# Patient Record
Sex: Male | Born: 1945 | Race: White | Hispanic: No | Marital: Married | Smoking: Never smoker
Health system: Southern US, Community
[De-identification: ages and names within clinical notes are randomized; demographics above are authoritative.]

## PROBLEM LIST (undated history)

## (undated) DIAGNOSIS — R011 Cardiac murmur, unspecified: Secondary | ICD-10-CM

## (undated) DIAGNOSIS — K219 Gastro-esophageal reflux disease without esophagitis: Secondary | ICD-10-CM

## (undated) DIAGNOSIS — I10 Essential (primary) hypertension: Secondary | ICD-10-CM

## (undated) DIAGNOSIS — M1990 Unspecified osteoarthritis, unspecified site: Secondary | ICD-10-CM

## (undated) HISTORY — PX: OTHER SURGICAL HISTORY: SHX169

---

## 2000-04-20 ENCOUNTER — Ambulatory Visit (HOSPITAL_COMMUNITY): Admission: RE | Admit: 2000-04-20 | Discharge: 2000-04-20 | Payer: Self-pay | Admitting: Urology

## 2009-08-21 ENCOUNTER — Emergency Department (HOSPITAL_COMMUNITY): Admission: EM | Admit: 2009-08-21 | Discharge: 2009-08-21 | Payer: Self-pay | Admitting: Emergency Medicine

## 2009-08-31 ENCOUNTER — Ambulatory Visit (HOSPITAL_COMMUNITY): Admission: RE | Admit: 2009-08-31 | Discharge: 2009-08-31 | Payer: Self-pay | Admitting: Orthopedic Surgery

## 2010-09-16 LAB — CBC
HCT: 44.5 % (ref 39.0–52.0)
Platelets: 201 10*3/uL (ref 150–400)
WBC: 5.3 10*3/uL (ref 4.0–10.5)

## 2010-09-16 LAB — BASIC METABOLIC PANEL
BUN: 21 mg/dL (ref 6–23)
CO2: 29 mEq/L (ref 19–32)
Calcium: 9.1 mg/dL (ref 8.4–10.5)
Chloride: 101 mEq/L (ref 96–112)
Creatinine, Ser: 1.17 mg/dL (ref 0.4–1.5)
Potassium: 3.9 mEq/L (ref 3.5–5.1)

## 2010-11-08 NOTE — Op Note (Signed)
[facility]  Patient:    Colton Calderon, Colton Calderon                       MRN: [id number] Proc. Date: 04/20/00 Attending:  Loraine Leriche C. Vernie Ammons, M.D.                           Operative Report  PREOPERATIVE DIAGNOSIS:  Right epididymal cyst and desire for sterilization.  POSTOPERATIVE DIAGNOSIS:  Right epididymal cyst, desire for sterilization, and right varicocele.  OPERATION: 1. Right epididymal cyst marsupialization. 2. Bilateral vasectomy. 3. Right varicocelectomy.  SURGEON:  Mark C. Vernie Ammons, M.D.  ANESTHESIA:  General.  SPECIMEN:  None.  DRAINS:  None.  BLOOD LOSS:  Less than 5 cc.  COMPLICATIONS:  None.  INDICATIONS:  The patient is a 65 year old white male who noted swelling in the right hemiscrotum and discomfort.  He was found in my office to have cystic mass involving the head of the right epididymis.  He also had a small varicocele noted preoperatively, but it did not appear to be a very significant finding.  He also desires sterility and has asked that I perform a vasectomy under the same anesthetic.  He understands the risks, complications, alternatives, and limitations and has elected to proceed.  DESCRIPTION OF PROCEDURE:  After informed consent, the patient went to the operating room, was placed on the table, and administered general anesthesia. His genitalia was sterilely prepped and draped.  I made a medial raphe scrotal incision and then grasped the left vas with the vas clamp, clearing off the overlying tissue and freeing up the vas completely.  Approximately a 2 cm segment of the vas was then excised, and 2-0 silk suture was used to ligate each end of the vas.  I then fulgurated each end of the vas and allowed each portion to drop back into the left hemiscrotum.  I then directed my attention to the right hemiscrotum where I incised the tunica vaginalis and delivered the testicle itself.  Several large cystic structures were noted to  be associated with the upper pole of the epididymis.  I then incised along the medial aspect and opened the cystic structures which were all filled with clear fluid.  A 4-0 chromic suture was then used to marsupialize the edge of the overlying tunica.  Several sutures were placed in interrupted fashion.  I noted, with the testicle out of the scrotum, that there did appear to be a fairly significant varicocele, and because the patient was having pain on that side, I felt it prudent to ligate the varicosities because of the possibility that his discomfort was being caused by the varicocele and not being the cyst. I, therefore, isolated the vein from surrounding arterial and lymphatic structures and ligated these with 2-0 chromic suture.  I then grasped the vas on this side with the vas clamp and again cleared it of surrounding tissues, ligated it doubly proximally and singly distally, fulgurating the ends once again and allowing them to drop back into the cord tissue.  I then replaced the testicle back in its parietal tunica vaginalis and closed that with a running 4-0 chromic suture.  I then replaced the right testicle in its normal anatomic position in the hemiscrotum and made sure all bleeding points were cauterized.  I then closed the deep tissues with a running 3-0 chromic suture and anesthetized the scrotal subcutaneous tissue with 0.25% plain Marcaine.  I then closed the scrotal skin with a running 3-0 chromic suture.  The patient tolerated the procedure well.  There were no intraoperative complications. Needle, sponge, and instrument counts were reportedly correct at the end of the operation.  He will begin on a prescription for 40 Tylox and instructions on postop care and will follow up in my office in two weeks for recheck. DD:  04/20/00 TD:  04/20/00 Job: [id number]

## 2014-01-16 ENCOUNTER — Other Ambulatory Visit (HOSPITAL_COMMUNITY): Payer: Self-pay | Admitting: Orthopaedic Surgery

## 2014-02-07 ENCOUNTER — Encounter (HOSPITAL_COMMUNITY): Payer: Self-pay | Admitting: Pharmacy Technician

## 2014-02-08 NOTE — Patient Instructions (Signed)
Colton LudwigCraig S Calderon  02/08/2014   Your procedure is scheduled on:  02/17/14    Report to [facility] Main Entrance.  Follow the Signs to Short Stay Center at   0530     am  Call this number if you have problems the morning of surgery: [phone]   Remember:   Do not eat food or drink liquids after midnight.   Take these medicines the morning of surgery with A SIP OF WATER:    Do not wear jewelry,   Do not wear lotions, powders, or perfumes. , deodorant  . Men may shave face and neck.  Do not bring valuables to the hospital.  Contacts, dentures or bridgework may not be worn into surgery.  Leave suitcase in the car. After surgery it may be brought to your room.  For patients admitted to the hospital, checkout time is 11:00 AM the day of  discharge.      [facility] - Preparing for Surgery Before surgery, you can play an important role.  Because skin is not sterile, your skin needs to be as free of germs as possible.  You can reduce the number of germs on your skin by washing with CHG (chlorahexidine gluconate) soap before surgery.  CHG is an antiseptic cleaner which kills germs and bonds with the skin to continue killing germs even after washing. Please DO NOT use if you have an allergy to CHG or antibacterial soaps.  If your skin becomes reddened/irritated stop using the CHG and inform your nurse when you arrive at Short Stay. Do not shave (including legs and underarms) for at least 48 hours prior to the first CHG shower.  You may shave your face/neck. Please follow these instructions carefully:  1.  Shower with CHG Soap the night before surgery and the  morning of Surgery.  2.  If you choose to wash your hair, wash your hair first as usual with your  normal  shampoo.  3.  After you shampoo, rinse your hair and body thoroughly to remove the  shampoo.                           4.  Use CHG as you would any other liquid soap.  You can apply chg directly  to the skin and wash   Gently with a scrungie or clean washcloth.  5.  Apply the CHG Soap to your body ONLY FROM THE NECK DOWN.   Do not use on face/ open                           Wound or open sores. Avoid contact with eyes, ears mouth and genitals (private parts).                       Wash face,  Genitals (private parts) with your normal soap.             6.  Wash thoroughly, paying special attention to the area where your surgery  will be performed.  7.  Thoroughly rinse your body with warm water from the neck down.  8.  DO NOT shower/wash with your normal soap after using and rinsing off  the CHG Soap.                9.  Pat yourself dry with a clean towel.  10.  Wear clean pajamas.            11.  Place clean sheets on your bed the night of your first shower and do not  sleep with pets. Day of Surgery : Do not apply any lotions/deodorants the morning of surgery.  Please wear clean clothes to the hospital/surgery center.  FAILURE TO FOLLOW THESE INSTRUCTIONS MAY RESULT IN THE CANCELLATION OF YOUR SURGERY PATIENT SIGNATURE_________________________________  NURSE SIGNATURE__________________________________  ________________________________________________________________________  WHAT IS A BLOOD TRANSFUSION? Blood Transfusion Information  A transfusion is the replacement of blood or some of its parts. Blood is made up of multiple cells which provide different functions.  Red blood cells carry oxygen and are used for blood loss replacement.  White blood cells fight against infection.  Platelets control bleeding.  Plasma helps clot blood.  Other blood products are available for specialized needs, such as hemophilia or other clotting disorders. BEFORE THE TRANSFUSION  Who gives blood for transfusions?   Healthy volunteers who are fully evaluated to make sure their blood is safe. This is blood bank blood. Transfusion therapy is the safest it has ever been in the practice of medicine. Before  blood is taken from a donor, a complete history is taken to make sure that person has no history of diseases nor engages in risky social behavior (examples are intravenous drug use or sexual activity with multiple partners). The donor's travel history is screened to minimize risk of transmitting infections, such as malaria. The donated blood is tested for signs of infectious diseases, such as HIV and hepatitis. The blood is then tested to be sure it is compatible with you in order to minimize the chance of a transfusion reaction. If you or a relative donates blood, this is often done in anticipation of surgery and is not appropriate for emergency situations. It takes many days to process the donated blood. RISKS AND COMPLICATIONS Although transfusion therapy is very safe and saves many lives, the main dangers of transfusion include:   Getting an infectious disease.  Developing a transfusion reaction. This is an allergic reaction to something in the blood you were given. Every precaution is taken to prevent this. The decision to have a blood transfusion has been considered carefully by your caregiver before blood is given. Blood is not given unless the benefits outweigh the risks. AFTER THE TRANSFUSION  Right after receiving a blood transfusion, you will usually feel much better and more energetic. This is especially true if your red blood cells have gotten low (anemic). The transfusion raises the level of the red blood cells which carry oxygen, and this usually causes an energy increase.  The nurse administering the transfusion will monitor you carefully for complications. HOME CARE INSTRUCTIONS  No special instructions are needed after a transfusion. You may find your energy is better. Speak with your caregiver about any limitations on activity for underlying diseases you may have. SEEK MEDICAL CARE IF:   Your condition is not improving after your transfusion.  You develop redness or irritation  at the intravenous (IV) site. SEEK IMMEDIATE MEDICAL CARE IF:  Any of the following symptoms occur over the next 12 hours:  Shaking chills.  You have a temperature by mouth above 102 F (38.9 C), not controlled by medicine.  Chest, back, or muscle pain.  People around you feel you are not acting correctly or are confused.  Shortness of breath or difficulty breathing.  Dizziness and fainting.  You get a rash or develop  hives.  You have a decrease in urine output.  Your urine turns a dark color or changes to pink, red, or brown. Any of the following symptoms occur over the next 10 days:  You have a temperature by mouth above 102 F (38.9 C), not controlled by medicine.  Shortness of breath.  Weakness after normal activity.  The white part of the eye turns yellow (jaundice).  You have a decrease in the amount of urine or are urinating less often.  Your urine turns a dark color or changes to pink, red, or brown. Document Released: 06/06/2000 Document Revised: 09/01/2011 Document Reviewed: 01/24/2008 ExitCare Patient Information 2014 ExitCare, [place].  _______________________________________________________________________   Please read over the following fact sheets that you were given: MRSA Information, coughing and deep breathing exercises, leg exercises

## 2014-02-09 ENCOUNTER — Ambulatory Visit (HOSPITAL_COMMUNITY)
Admission: RE | Admit: 2014-02-09 | Discharge: 2014-02-09 | Disposition: A | Discharge status: Home / Self Care | Payer: 59 | Source: Ambulatory Visit | Attending: Orthopaedic Surgery | Admitting: Orthopaedic Surgery

## 2014-02-09 ENCOUNTER — Encounter (HOSPITAL_COMMUNITY)
Admission: RE | Admit: 2014-02-09 | Discharge: 2014-02-09 | Disposition: A | Discharge status: Home / Self Care | Payer: 59 | Source: Ambulatory Visit | Attending: Orthopaedic Surgery | Admitting: Orthopaedic Surgery

## 2014-02-09 ENCOUNTER — Encounter (HOSPITAL_COMMUNITY): Payer: Self-pay

## 2014-02-09 DIAGNOSIS — Z01818 Encounter for other preprocedural examination: Secondary | ICD-10-CM | POA: Insufficient documentation

## 2014-02-09 DIAGNOSIS — I10 Essential (primary) hypertension: Secondary | ICD-10-CM | POA: Diagnosis not present

## 2014-02-09 HISTORY — DX: Cardiac murmur, unspecified: R01.1

## 2014-02-09 HISTORY — DX: Gastro-esophageal reflux disease without esophagitis: K21.9

## 2014-02-09 HISTORY — DX: Unspecified osteoarthritis, unspecified site: M19.90

## 2014-02-09 HISTORY — DX: Essential (primary) hypertension: I10

## 2014-02-09 LAB — CBC
HEMATOCRIT: 43.4 % (ref 39.0–52.0)
HEMOGLOBIN: 14.8 g/dL (ref 13.0–17.0)
MCH: 32.1 pg (ref 26.0–34.0)
MCHC: 34.1 g/dL (ref 30.0–36.0)
MCV: 94.1 fL (ref 78.0–100.0)
PLATELETS: 202 10*3/uL (ref 150–400)
RBC: 4.61 MIL/uL (ref 4.22–5.81)
RDW: 13.3 % (ref 11.5–15.5)
WBC: 4.6 10*3/uL (ref 4.0–10.5)

## 2014-02-09 LAB — BASIC METABOLIC PANEL
ANION GAP: 10 (ref 5–15)
BUN: 21 mg/dL (ref 6–23)
CALCIUM: 9.5 mg/dL (ref 8.4–10.5)
CO2: 27 mEq/L (ref 19–32)
CREATININE: 0.99 mg/dL (ref 0.50–1.35)
Chloride: 101 mEq/L (ref 96–112)
GFR calc non Af Amer: 82 mL/min — ABNORMAL LOW (ref >90)
Glucose, Bld: 136 mg/dL — ABNORMAL HIGH (ref 70–99)
Potassium: 4.6 mEq/L (ref 3.7–5.3)
SODIUM: 138 meq/L (ref 137–147)

## 2014-02-09 LAB — SURGICAL PCR SCREEN
MRSA, PCR: NEGATIVE
STAPHYLOCOCCUS AUREUS: NEGATIVE

## 2014-02-09 LAB — URINALYSIS, ROUTINE W REFLEX MICROSCOPIC
BILIRUBIN URINE: NEGATIVE
Glucose, UA: NEGATIVE mg/dL
Hgb urine dipstick: NEGATIVE
KETONES UR: NEGATIVE mg/dL
LEUKOCYTES UA: NEGATIVE
NITRITE: NEGATIVE
PROTEIN: NEGATIVE mg/dL
Specific Gravity, Urine: 1.02 (ref 1.005–1.030)
Urobilinogen, UA: 0.2 mg/dL (ref 0.0–1.0)
pH: 5.5 (ref 5.0–8.0)

## 2014-02-09 LAB — APTT: aPTT: 27 seconds (ref 24–37)

## 2014-02-09 LAB — ABO/RH: ABO/RH(D): A POS

## 2014-02-09 LAB — PROTIME-INR
INR: 1.03 (ref 0.00–1.49)
Prothrombin Time: 13.5 seconds (ref 11.6–15.2)

## 2014-02-09 NOTE — Progress Notes (Signed)
[facility]

## 2014-02-17 ENCOUNTER — Encounter (HOSPITAL_COMMUNITY)
Admission: RE | Disposition: A | Discharge status: Home Health | Payer: Self-pay | Source: Ambulatory Visit | Attending: Orthopaedic Surgery

## 2014-02-17 ENCOUNTER — Inpatient Hospital Stay (HOSPITAL_COMMUNITY): Payer: 59

## 2014-02-17 ENCOUNTER — Encounter (HOSPITAL_COMMUNITY): Payer: Self-pay | Admitting: *Deleted

## 2014-02-17 ENCOUNTER — Inpatient Hospital Stay (HOSPITAL_COMMUNITY): Payer: 59 | Admitting: Certified Registered Nurse Anesthetist

## 2014-02-17 ENCOUNTER — Inpatient Hospital Stay (HOSPITAL_COMMUNITY)
Admission: RE | Admit: 2014-02-17 | Discharge: 2014-02-19 | DRG: 470 | Disposition: A | Discharge status: Home Health | Payer: 59 | Source: Ambulatory Visit | Attending: Orthopaedic Surgery | Admitting: Orthopaedic Surgery

## 2014-02-17 ENCOUNTER — Encounter (HOSPITAL_COMMUNITY): Payer: 59 | Admitting: Certified Registered Nurse Anesthetist

## 2014-02-17 DIAGNOSIS — M1612 Unilateral primary osteoarthritis, left hip: Secondary | ICD-10-CM

## 2014-02-17 DIAGNOSIS — Z6825 Body mass index (BMI) 25.0-25.9, adult: Secondary | ICD-10-CM

## 2014-02-17 DIAGNOSIS — I10 Essential (primary) hypertension: Secondary | ICD-10-CM | POA: Diagnosis present

## 2014-02-17 DIAGNOSIS — K219 Gastro-esophageal reflux disease without esophagitis: Secondary | ICD-10-CM | POA: Diagnosis present

## 2014-02-17 DIAGNOSIS — Z79899 Other long term (current) drug therapy: Secondary | ICD-10-CM | POA: Diagnosis not present

## 2014-02-17 DIAGNOSIS — M1610 Unilateral primary osteoarthritis, unspecified hip: Secondary | ICD-10-CM | POA: Diagnosis present

## 2014-02-17 DIAGNOSIS — Z96642 Presence of left artificial hip joint: Secondary | ICD-10-CM

## 2014-02-17 DIAGNOSIS — D62 Acute posthemorrhagic anemia: Secondary | ICD-10-CM | POA: Diagnosis not present

## 2014-02-17 DIAGNOSIS — M25559 Pain in unspecified hip: Secondary | ICD-10-CM | POA: Diagnosis present

## 2014-02-17 DIAGNOSIS — M169 Osteoarthritis of hip, unspecified: Secondary | ICD-10-CM | POA: Diagnosis present

## 2014-02-17 HISTORY — PX: TOTAL HIP ARTHROPLASTY: SHX124

## 2014-02-17 LAB — TYPE AND SCREEN
ABO/RH(D): A POS
ANTIBODY SCREEN: NEGATIVE

## 2014-02-17 SURGERY — ARTHROPLASTY, HIP, TOTAL, ANTERIOR APPROACH
Anesthesia: Spinal | Site: Hip | Laterality: Left

## 2014-02-17 MED ORDER — SODIUM CHLORIDE 0.9 % IJ SOLN
INTRAMUSCULAR | Status: AC
  Filled 2014-02-17: qty 10

## 2014-02-17 MED ORDER — SODIUM CHLORIDE 0.9 % IV SOLN
INTRAVENOUS | Status: DC
  Administered 2014-02-17 – 2014-02-18 (×3): via INTRAVENOUS

## 2014-02-17 MED ORDER — SODIUM CHLORIDE 0.9 % IR SOLN
Status: DC | PRN
  Administered 2014-02-17: 1000 mL

## 2014-02-17 MED ORDER — MENTHOL 3 MG MT LOZG
1.0000 | LOZENGE | OROMUCOSAL | Status: DC | PRN
  Filled 2014-02-17: qty 9

## 2014-02-17 MED ORDER — EPHEDRINE SULFATE 50 MG/ML IJ SOLN
INTRAMUSCULAR | Status: AC
  Filled 2014-02-17: qty 1

## 2014-02-17 MED ORDER — ACETAMINOPHEN 325 MG PO TABS
650.0000 mg | ORAL_TABLET | Freq: Four times a day (QID) | ORAL | Status: DC | PRN
  Administered 2014-02-17 – 2014-02-19 (×4): 650 mg via ORAL
  Filled 2014-02-17 (×4): qty 2

## 2014-02-17 MED ORDER — PHENYLEPHRINE HCL 10 MG/ML IJ SOLN
INTRAMUSCULAR | Status: DC | PRN
  Administered 2014-02-17 (×4): 20 ug via INTRAVENOUS

## 2014-02-17 MED ORDER — HYDROCHLOROTHIAZIDE 25 MG PO TABS
25.0000 mg | ORAL_TABLET | Freq: Every morning | ORAL | Status: DC
  Administered 2014-02-17 – 2014-02-19 (×2): 25 mg via ORAL
  Filled 2014-02-17 (×3): qty 1

## 2014-02-17 MED ORDER — METOCLOPRAMIDE HCL 5 MG/ML IJ SOLN: 5.0000 mg | Freq: Three times a day (TID) | INTRAMUSCULAR | Status: DC | PRN

## 2014-02-17 MED ORDER — ATROPINE SULFATE 0.4 MG/ML IJ SOLN
INTRAMUSCULAR | Status: AC
  Filled 2014-02-17: qty 1

## 2014-02-17 MED ORDER — PANTOPRAZOLE SODIUM 40 MG PO TBEC
40.0000 mg | DELAYED_RELEASE_TABLET | Freq: Every day | ORAL | Status: DC
  Administered 2014-02-17 – 2014-02-19 (×3): 40 mg via ORAL
  Filled 2014-02-17 (×3): qty 1

## 2014-02-17 MED ORDER — LACTATED RINGERS IV SOLN
INTRAVENOUS | Status: DC | PRN
  Administered 2014-02-17 (×2): via INTRAVENOUS

## 2014-02-17 MED ORDER — ATORVASTATIN CALCIUM 10 MG PO TABS
10.0000 mg | ORAL_TABLET | Freq: Every day | ORAL | Status: DC
  Administered 2014-02-17 – 2014-02-19 (×2): 10 mg via ORAL
  Filled 2014-02-17 (×3): qty 1

## 2014-02-17 MED ORDER — 0.9 % SODIUM CHLORIDE (POUR BTL) OPTIME
TOPICAL | Status: DC | PRN
  Administered 2014-02-17: 1000 mL

## 2014-02-17 MED ORDER — FENTANYL CITRATE 0.05 MG/ML IJ SOLN
INTRAMUSCULAR | Status: DC | PRN
  Administered 2014-02-17 (×2): 50 ug via INTRAVENOUS

## 2014-02-17 MED ORDER — DEXTROSE 5 % IV SOLN
500.0000 mg | Freq: Four times a day (QID) | INTRAVENOUS | Status: DC | PRN
  Administered 2014-02-17: 500 mg via INTRAVENOUS
  Filled 2014-02-17: qty 5

## 2014-02-17 MED ORDER — ASPIRIN EC 325 MG PO TBEC
325.0000 mg | DELAYED_RELEASE_TABLET | Freq: Two times a day (BID) | ORAL | Status: DC
  Administered 2014-02-18 – 2014-02-19 (×3): 325 mg via ORAL
  Filled 2014-02-17 (×5): qty 1

## 2014-02-17 MED ORDER — ONDANSETRON HCL 4 MG/2ML IJ SOLN
4.0000 mg | Freq: Four times a day (QID) | INTRAMUSCULAR | Status: DC | PRN
  Administered 2014-02-18: 4 mg via INTRAVENOUS
  Filled 2014-02-17: qty 2

## 2014-02-17 MED ORDER — POLYETHYLENE GLYCOL 3350 17 G PO PACK: 17.0000 g | PACK | Freq: Every day | ORAL | Status: DC | PRN

## 2014-02-17 MED ORDER — FENTANYL CITRATE 0.05 MG/ML IJ SOLN
INTRAMUSCULAR | Status: AC
  Filled 2014-02-17: qty 2

## 2014-02-17 MED ORDER — PROPOFOL 10 MG/ML IV BOLUS
INTRAVENOUS | Status: AC
  Filled 2014-02-17: qty 20

## 2014-02-17 MED ORDER — ONDANSETRON HCL 4 MG/2ML IJ SOLN
INTRAMUSCULAR | Status: AC
  Filled 2014-02-17: qty 2

## 2014-02-17 MED ORDER — MIDAZOLAM HCL 5 MG/5ML IJ SOLN
INTRAMUSCULAR | Status: DC | PRN
  Administered 2014-02-17: 0.5 mg via INTRAVENOUS
  Administered 2014-02-17: 1 mg via INTRAVENOUS
  Administered 2014-02-17: 0.5 mg via INTRAVENOUS

## 2014-02-17 MED ORDER — OXYCODONE HCL 5 MG PO TABS: 5.0000 mg | ORAL_TABLET | Freq: Once | ORAL | Status: DC | PRN

## 2014-02-17 MED ORDER — MEPERIDINE HCL 50 MG/ML IJ SOLN: 6.2500 mg | INTRAMUSCULAR | Status: DC | PRN

## 2014-02-17 MED ORDER — DEXAMETHASONE SODIUM PHOSPHATE 10 MG/ML IJ SOLN
INTRAMUSCULAR | Status: AC
  Filled 2014-02-17: qty 1

## 2014-02-17 MED ORDER — ALUM & MAG HYDROXIDE-SIMETH 200-200-20 MG/5ML PO SUSP: 30.0000 mL | ORAL | Status: DC | PRN

## 2014-02-17 MED ORDER — PROPOFOL INFUSION 10 MG/ML OPTIME
INTRAVENOUS | Status: DC | PRN
  Administered 2014-02-17: 140 ug/kg/min via INTRAVENOUS

## 2014-02-17 MED ORDER — LISINOPRIL 40 MG PO TABS
40.0000 mg | ORAL_TABLET | Freq: Every morning | ORAL | Status: DC
  Administered 2014-02-17: 40 mg via ORAL
  Administered 2014-02-19: 20 mg via ORAL
  Filled 2014-02-17 (×3): qty 1

## 2014-02-17 MED ORDER — METOCLOPRAMIDE HCL 10 MG PO TABS: 5.0000 mg | ORAL_TABLET | Freq: Three times a day (TID) | ORAL | Status: DC | PRN

## 2014-02-17 MED ORDER — METHOCARBAMOL 500 MG PO TABS
500.0000 mg | ORAL_TABLET | Freq: Four times a day (QID) | ORAL | Status: DC | PRN
  Administered 2014-02-17 – 2014-02-19 (×5): 500 mg via ORAL
  Filled 2014-02-17 (×5): qty 1

## 2014-02-17 MED ORDER — CEFAZOLIN SODIUM-DEXTROSE 2-3 GM-% IV SOLR
2.0000 g | INTRAVENOUS | Status: AC
  Administered 2014-02-17: 2 g via INTRAVENOUS

## 2014-02-17 MED ORDER — MIDAZOLAM HCL 2 MG/2ML IJ SOLN
INTRAMUSCULAR | Status: AC
  Filled 2014-02-17: qty 2

## 2014-02-17 MED ORDER — DIPHENHYDRAMINE HCL 12.5 MG/5ML PO ELIX: 12.5000 mg | ORAL_SOLUTION | ORAL | Status: DC | PRN

## 2014-02-17 MED ORDER — ONDANSETRON HCL 4 MG PO TABS
4.0000 mg | ORAL_TABLET | Freq: Four times a day (QID) | ORAL | Status: DC | PRN
  Administered 2014-02-19: 4 mg via ORAL
  Filled 2014-02-17: qty 1

## 2014-02-17 MED ORDER — ACETAMINOPHEN 650 MG RE SUPP: 650.0000 mg | Freq: Four times a day (QID) | RECTAL | Status: DC | PRN

## 2014-02-17 MED ORDER — TRANEXAMIC ACID 100 MG/ML IV SOLN
1000.0000 mg | INTRAVENOUS | Status: AC
  Administered 2014-02-17: 1000 mg via INTRAVENOUS
  Filled 2014-02-17: qty 10

## 2014-02-17 MED ORDER — OXYCODONE HCL 5 MG PO TABS
5.0000 mg | ORAL_TABLET | ORAL | Status: DC | PRN
  Administered 2014-02-17: 10 mg via ORAL
  Administered 2014-02-17 (×2): 5 mg via ORAL
  Administered 2014-02-17: 10 mg via ORAL
  Administered 2014-02-18 – 2014-02-19 (×4): 5 mg via ORAL
  Administered 2014-02-19: 10 mg via ORAL
  Filled 2014-02-17 (×2): qty 2
  Filled 2014-02-17 (×6): qty 1
  Filled 2014-02-17: qty 2

## 2014-02-17 MED ORDER — DOCUSATE SODIUM 100 MG PO CAPS
100.0000 mg | ORAL_CAPSULE | Freq: Two times a day (BID) | ORAL | Status: DC
  Administered 2014-02-17 – 2014-02-19 (×5): 100 mg via ORAL

## 2014-02-17 MED ORDER — HYDROMORPHONE HCL PF 1 MG/ML IJ SOLN
1.0000 mg | INTRAMUSCULAR | Status: DC | PRN
  Administered 2014-02-19: 1 mg via INTRAVENOUS
  Filled 2014-02-17: qty 1

## 2014-02-17 MED ORDER — CEFAZOLIN SODIUM-DEXTROSE 2-3 GM-% IV SOLR
INTRAVENOUS | Status: AC
  Filled 2014-02-17: qty 50

## 2014-02-17 MED ORDER — BUPIVACAINE IN DEXTROSE 0.75-8.25 % IT SOLN
INTRATHECAL | Status: DC | PRN
  Administered 2014-02-17: 2 mL via INTRATHECAL

## 2014-02-17 MED ORDER — ZOLPIDEM TARTRATE 5 MG PO TABS: 5.0000 mg | ORAL_TABLET | Freq: Every evening | ORAL | Status: DC | PRN

## 2014-02-17 MED ORDER — DEXAMETHASONE SODIUM PHOSPHATE 4 MG/ML IJ SOLN
INTRAMUSCULAR | Status: DC | PRN
  Administered 2014-02-17: 10 mg via INTRAVENOUS

## 2014-02-17 MED ORDER — OXYCODONE HCL 5 MG/5ML PO SOLN
5.0000 mg | Freq: Once | ORAL | Status: DC | PRN
  Filled 2014-02-17: qty 5

## 2014-02-17 MED ORDER — PHENOL 1.4 % MT LIQD
1.0000 | OROMUCOSAL | Status: DC | PRN
  Filled 2014-02-17: qty 177

## 2014-02-17 MED ORDER — PROMETHAZINE HCL 25 MG/ML IJ SOLN: 6.2500 mg | INTRAMUSCULAR | Status: DC | PRN

## 2014-02-17 MED ORDER — HYDROMORPHONE HCL PF 1 MG/ML IJ SOLN: 0.2500 mg | INTRAMUSCULAR | Status: DC | PRN

## 2014-02-17 MED ORDER — ONDANSETRON HCL 4 MG/2ML IJ SOLN
INTRAMUSCULAR | Status: DC | PRN
  Administered 2014-02-17: 4 mg via INTRAVENOUS

## 2014-02-17 MED ORDER — CEFAZOLIN SODIUM 1-5 GM-% IV SOLN
1.0000 g | Freq: Four times a day (QID) | INTRAVENOUS | Status: AC
  Administered 2014-02-17 (×2): 1 g via INTRAVENOUS
  Filled 2014-02-17 (×3): qty 50

## 2014-02-17 SURGICAL SUPPLY — 48 items
APL SKNCLS STERI-STRIP NONHPOA (GAUZE/BANDAGES/DRESSINGS) ×1
BAG SPEC THK2 15X12 ZIP CLS (MISCELLANEOUS)
BAG ZIPLOCK 12X15 (MISCELLANEOUS) IMPLANT
BENZOIN TINCTURE PRP APPL 2/3 (GAUZE/BANDAGES/DRESSINGS) ×1 IMPLANT
BLADE SAW SGTL 18X1.27X75 (BLADE) ×2 IMPLANT
CAPT HIP PF COP ×1 IMPLANT
CELLS DAT CNTRL 66122 CELL SVR (MISCELLANEOUS) ×1 IMPLANT
COVER PERINEAL POST (MISCELLANEOUS) ×2 IMPLANT
DRAPE C-ARM 42X120 X-RAY (DRAPES) ×2 IMPLANT
DRAPE STERI IOBAN 125X83 (DRAPES) ×2 IMPLANT
DRAPE U-SHAPE 47X51 STRL (DRAPES) ×6 IMPLANT
DRSG AQUACEL AG ADV 3.5X10 (GAUZE/BANDAGES/DRESSINGS) ×2 IMPLANT
DURAPREP 26ML APPLICATOR (WOUND CARE) ×2 IMPLANT
ELECT BLADE TIP CTD 4 INCH (ELECTRODE) ×2 IMPLANT
ELECT REM PT RETURN 9FT ADLT (ELECTROSURGICAL) ×2
ELECTRODE REM PT RTRN 9FT ADLT (ELECTROSURGICAL) ×1 IMPLANT
FACESHIELD WRAPAROUND (MASK) ×4 IMPLANT
FACESHIELD WRAPAROUND OR TEAM (MASK) ×4 IMPLANT
GAUZE XEROFORM 1X8 LF (GAUZE/BANDAGES/DRESSINGS) IMPLANT
GLOVE BIO SURGEON STRL SZ7.5 (GLOVE) ×2 IMPLANT
GLOVE BIO SURGEON STRL SZ8 (GLOVE) ×1 IMPLANT
GLOVE BIOGEL PI IND STRL 7.0 (GLOVE) IMPLANT
GLOVE BIOGEL PI IND STRL 8 (GLOVE) ×2 IMPLANT
GLOVE BIOGEL PI IND STRL 8.5 (GLOVE) IMPLANT
GLOVE BIOGEL PI INDICATOR 7.0 (GLOVE) ×1
GLOVE BIOGEL PI INDICATOR 8 (GLOVE) ×2
GLOVE BIOGEL PI INDICATOR 8.5 (GLOVE) ×1
GLOVE ECLIPSE 8.0 STRL XLNG CF (GLOVE) ×2 IMPLANT
GLOVE SURG SS PI 6.5 STRL IVOR (GLOVE) ×1 IMPLANT
GLOVE SURG SS PI 7.0 STRL IVOR (GLOVE) ×1 IMPLANT
GOWN STRL REUS W/TWL XL LVL3 (GOWN DISPOSABLE) ×6 IMPLANT
HANDPIECE INTERPULSE COAX TIP (DISPOSABLE) ×2
KIT BASIN OR (CUSTOM PROCEDURE TRAY) ×2 IMPLANT
PACK TOTAL JOINT (CUSTOM PROCEDURE TRAY) ×2 IMPLANT
RETRACTOR WND ALEXIS 18 MED (MISCELLANEOUS) ×1 IMPLANT
RTRCTR WOUND ALEXIS 18CM MED (MISCELLANEOUS) ×2
SET HNDPC FAN SPRY TIP SCT (DISPOSABLE) ×1 IMPLANT
STAPLER VISISTAT 35W (STAPLE) IMPLANT
STRIP CLOSURE SKIN 1/2X4 (GAUZE/BANDAGES/DRESSINGS) ×1 IMPLANT
SUT ETHIBOND NAB CT1 #1 30IN (SUTURE) ×2 IMPLANT
SUT MNCRL AB 4-0 PS2 18 (SUTURE) ×1 IMPLANT
SUT VIC AB 0 CT1 36 (SUTURE) ×2 IMPLANT
SUT VIC AB 1 CT1 36 (SUTURE) ×2 IMPLANT
SUT VIC AB 2-0 CT1 27 (SUTURE) ×4
SUT VIC AB 2-0 CT1 TAPERPNT 27 (SUTURE) ×2 IMPLANT
TOWEL OR 17X26 10 PK STRL BLUE (TOWEL DISPOSABLE) ×2 IMPLANT
TOWEL OR NON WOVEN STRL DISP B (DISPOSABLE) ×2 IMPLANT
TRAY FOLEY CATH 16FRSI W/METER (SET/KITS/TRAYS/PACK) ×1 IMPLANT

## 2014-02-17 NOTE — Progress Notes (Signed)
Physical Therapy Treatment Patient Details Name: Colton Calderon MRN: [id number] WICHMANB: 27-Aug-1945 Today's Date: 02/17/2014    History of Present Illness L THR    PT Comments    Pt s/p L THR presents with decreased L LE strength/ROM and post op pain limiting functional mobility.  Pt should progress to d/c home with family assist and HHPT follow up.  Follow Up Recommendations  Home health PT     Equipment Recommendations  None recommended by PT    Recommendations for Other Services OT consult     Precautions / Restrictions Precautions Precautions: Fall Restrictions Weight Bearing Restrictions: No Other Position/Activity Restrictions: WBAT    Mobility  Bed Mobility Overal bed mobility: Needs Assistance Bed Mobility: Supine to Sit     Supine to sit: Min assist;Mod assist     General bed mobility comments: cues for sequence and use of R LE to self assist  Transfers Overall transfer level: Needs assistance Equipment used: Rolling walker (2 wheeled) Transfers: Sit to/from Stand Sit to Stand: Min assist;Mod assist         General transfer comment: cues for LE management and use of UEs to self assist  Ambulation/Gait Ambulation/Gait assistance: Min assist;Mod assist Ambulation Distance (Feet): 22 Feet Assistive device: Rolling walker (2 wheeled) Gait Pattern/deviations: Step-to pattern;Decreased step length - right;Decreased step length - left;Shuffle;Trunk flexed     General Gait Details: cues for posture, position from RW and sequence   Stairs            Wheelchair Mobility    Modified Rankin (Stroke Patients Only)       Balance                                    Cognition Arousal/Alertness: Awake/alert Behavior During Therapy: WFL for tasks assessed/performed Overall Cognitive Status: Within Functional Limits for tasks assessed                      Exercises Total Joint Exercises Ankle Circles/Pumps: AROM;Both;10  reps;Supine Quad Sets: AROM;Both;10 reps;Supine Heel Slides: AAROM;15 reps;Supine;Left Hip ABduction/ADduction: AAROM;Left;10 reps;Supine    General Comments        Pertinent Vitals/Pain Pain Assessment: 0-10 Pain Score: 4  Pain Location: L hip Pain Descriptors / Indicators: Aching;Burning;Sore Pain Intervention(s): Limited activity within patient's tolerance;Monitored during session;Premedicated before session;Ice applied    Home Living Family/patient expects to be discharged to:: Private residence Living Arrangements: Spouse/significant other Available Help at Discharge: Family Type of Home: House Home Access: Stairs to enter Entrance Stairs-Rails: Left Home Layout: One level Home Equipment: Environmental consultant - 2 wheels;Cane - quad;Crutches      Prior Function Level of Independence: Independent          PT Goals (current goals can now be found in the care plan section) Acute Rehab PT Goals Patient Stated Goal: Wax my car  PT Goal Formulation: With patient Time For Goal Achievement: 02/25/14 Potential to Achieve Goals: Good    Frequency  7X/week    PT Plan      Co-evaluation             End of Session Equipment Utilized During Treatment: Gait belt Activity Tolerance: Patient tolerated treatment well;Other (comment) (mild nausea/dizziness ) Patient left: in chair;with call bell/phone within reach;with family/visitor present     Time: 1547-1630 PT Time Calculation (min): 43 min  Charges:  $Gait Training: 8-22 mins $Therapeutic  Exercise: 8-22 mins                    G Codes:      Colton Calderon 19-Mar-2014, 6:02 PM

## 2014-02-17 NOTE — Progress Notes (Addendum)
CARE MANAGEMENT NOTE 02/17/2014  Patient:  Colton Calderon, Colton Calderon   Account Number:  [id number]  Date Initiated:  02/17/2014  Documentation initiated by:  DAVIS,RHONDA  Subjective/Objective Assessment:   left total hip ant approach     Action/Plan:   home with hhc and needed dme   Anticipated DC Date:  02/20/2014   Anticipated DC Plan:  HOME W HOME HEALTH SERVICES  In-house referral  NA      DC Planning Services  CM consult      [facility] Choice  NA   Choice offered to / List presented to:  C-1 Patient      DME agency     HH arranged  HH-2 PT      [facility] agency  [facility]   Status of service:  In process, will continue to follow Medicare Important Message given?  NA - LOS <3 / Initial given by admissions (If response is "NO", the following Medicare IM given date fields will be blank) Date Medicare IM given:   Medicare IM given by:   Date Additional Medicare IM given:   Additional Medicare IM given by:    Discharge Disposition:    Per UR Regulation:  Reviewed for med. necessity/level of care/duration of stay  If discussed at Long Length of Stay Meetings, dates discussed:    Comments:  Rhonda Davis,RN,BSN,CCM Patient states that he has a rolling walker a dn a 3 in 1 commode available at home.

## 2014-02-17 NOTE — Anesthesia Procedure Notes (Signed)
Spinal  Patient location during procedure: OR Start time: 02/17/2014 7:30 AM Staffing CRNA/Resident: Dion Saucier E Performed by: resident/CRNA  Preanesthetic Checklist Completed: patient identified, site marked, surgical consent, pre-op evaluation, timeout performed, IV checked, risks and benefits discussed and monitors and equipment checked Spinal Block Patient position: sitting Prep: Betadine Approach: midline Location: L2-3 Injection technique: single-shot Needle Needle type: Sprotte  Needle gauge: 22 G Needle length: 9 cm Additional Notes Kit expiration date checked and confirmed.  Negative heme, paresthesias. Clear csf,  good flow, tolerated well.

## 2014-02-17 NOTE — Transfer of Care (Signed)
Immediate Anesthesia Transfer of Care Note  Patient: Colton Calderon  Procedure(s) Performed: Procedure(s): LEFT TOTAL HIP ARTHROPLASTY ANTERIOR APPROACH (Left)  Patient Location: PACU  Anesthesia Type:Spinal  Level of Consciousness: awake, alert , oriented and patient cooperative  Airway & Oxygen Therapy: Patient Spontanous Breathing and Patient connected to face mask oxygen  Post-op Assessment: Report given to PACU RN and Post -op Vital signs reviewed and stable  Post vital signs: Reviewed and stable  Complications: No apparent anesthesia complications

## 2014-02-17 NOTE — Op Note (Signed)
Colton Calderon, Colton Calderon                 ACCOUNT NO.:  [id number]  MEDICAL RECORD NO.:  [id number]  LOCATION:  [facility]                         FACILITY:  [facility]  PHYSICIAN:  Vanita Panda. Magnus Ivan, M.D.DATE OF BIRTH:  Jul 03, 1945  DATE OF PROCEDURE:  02/17/2014 DATE OF DISCHARGE:                              OPERATIVE REPORT   PREOPERATIVE DIAGNOSIS:  Severe osteoarthritis and degenerative joint disease, left hip.  POSTOPERATIVE DIAGNOSIS:  Severe osteoarthritis and degenerative joint disease, left hip.  PROCEDURE:  Left total hip arthroplasty through direct anterior approach.  IMPLANTS:  DePuy Sector Gription acetabular component size 52 with apex hole eliminator guide, size 36+ 4 neutral polyethylene liner, size 13 Corail femoral component with standard offset, size 36+ 8.5 ceramic hip ball.  SURGEON:  Doneen Poisson, MD.  ASSISTING:  Richardean Canal, PA-C.  ANESTHESIA:  Spinal.  ANTIBIOTICS:  2 g IV Ancef.  BLOOD LOSS:  200 mL.  COMPLICATIONS:  None.  INDICATIONS:  Colton Calderon is a 68 year old gentleman with worsening hip pain over 2-3 years now of his left hip.  He has failed all forms of conservative treatment.  The pain has gotten over to affect his mobility, his activities of daily living, and his quality of life.  He does wish to proceed with a direct anterior left total hip arthroplasty. The risks and benefits of this were explained to him in detail including the risks of acute blood loss anemia, nerve and vessel injury, fracture infection, dislocation, and DVT.  He understands the goals are decreased pain, improved mobility, and overall improved quality of life.  PROCEDURE DESCRIPTION:  After informed consent was obtained, appropriate left hip was marked.  He was brought to the operating room and spinal anesthesia was obtained.  He was then laid in a supine position on a stretcher.  Foley catheter was placed and both feet had traction boots applied to them.  Next,  he was placed supine on the hana fracture table with perineal post in place and both legs in inline skeletal traction devices, but no traction applied.  His left operative hip was prepped and draped with DuraPrep and sterile drapes.  A time-out was called identifying the correct patient, correct left hip.  We then made an incision inferior and posterior to the anterior superior iliac spine and carried this obliquely down the leg.  I dissected down to get easily to the tensor fascia lata muscle, and then the tensor fascia was divided longitudinally so that we could proceed with the direct anterior approach to the hip.  We cauterized the lateral femoral circumflex vessels and then put our Cobra retractors around the lateral neck and up underneath the rectus femoris on the medial neck.  We then opened up the hip capsule in a L-type and found the joint effusion.  We placed the Cobra retractors within the hip capsule.  We then made our femoral neck cut proximal to the lesser trochanter using an oscillating saw and completed this with an osteotome.  I placed a corkscrew guide in the femoral head and removed the femoral head in its entirety.  I then cleaned the acetabulum, remnants of acetabular labrum and debris.  We placed a  bent Hohmann medially and a Cobra retractor laterally.  I then began reaming from a size 42 reamer in 2 mm increments up to a size 52 with all reamers under direct visualization and last reamer also in direct fluoroscopy so that we could obtain our depth of reaming, our inclination, and anteversion.  Once I was pleased with this, I placed the real DePuy Sector Gription acetabular component size 52, the apex hole eliminator guide and then the real 36+ 4 neutral polyethylene liner for a size 52 acetabular component.  Attention was then turned to the femur.  With the leg externally rotated to 100 degrees, extended and adducted, we were able to use a rongeur to lateralize in a  box cutting osteotome to enter the femoral canal.  A Mueller retractor was placed medially and Hohmann retractor behind the greater trochanter.  I released the lateral joint capsule as well.  I then began broaching from a size 8 broach using the Corail broaching system for __________ up to a size 13.  With a size 13 placed, we trialed a standard neck and a 36+ 1.5 hip ball, reduced this in the acetabulum, it was surprisingly stable with significantly short; in anyway, we need to go up at least 2 hip ball size.  I then dislocated the hip and removed the trial components. I placed the real Corail femoral component with standard offset size 13, we placed the real 36+ 8.5 ceramic hip ball and reduced this back in the acetabulum and it was stable.  His offset and leg lengths were noted measured equal under direct fluoroscopy.  I was pleased with the range of motion and stability throughout the arc of motion.  We then copiously irrigated the soft tissue with normal saline solution using pulsatile lavage.  I closed the joint capsule with interrupted #1 Ethibond suture followed by a running #1 Vicryl in the tensor fascia, 0 Vicryl in the deep tissue, 2-0 Vicryl in subcutaneous tissue, 4-0 Monocryl subcuticular stitch and Steri-Strips on the skin.  An Aquacel dressing was applied, and he was taken off the hana table in the recovery room in stable condition.  All final counts were correct and no complications were noted.  Of note, Richardean Canal, PA-C assisted in the entire case, and his assistance was crucial for facilitating this case at all levels.     Vanita Panda. Magnus Ivan, M.D.     CYB/MEDQ  D:  02/17/2014  T:  02/17/2014  Job:  [id number]

## 2014-02-17 NOTE — H&P (Signed)
TOTAL HIP ADMISSION H&P  Patient is admitted for left total hip arthroplasty.  Subjective:  Chief Complaint: left hip pain  HPI: Colton Calderon, 68 y.o. male, has a history of pain and functional disability in the left hip(s) due to arthritis and patient has failed non-surgical conservative treatments for greater than 12 weeks to include NSAID's and/or analgesics and activity modification.  Onset of symptoms was gradual starting 3 years ago with gradually worsening course since that time.The patient noted no past surgery on the left hip(s).  Patient currently rates pain in the left hip at 8 out of 10 with activity. Patient has night pain, worsening of pain with activity and weight bearing, pain that interfers with activities of daily living and pain with passive range of motion. Patient has evidence of subchondral sclerosis, periarticular osteophytes and joint space narrowing by imaging studies. This condition presents safety issues increasing the risk of falls.  There is no current active infection.  Patient Active Problem List   Diagnosis Date Noted  . Arthritis of left hip 02/17/2014   Past Medical History  Diagnosis Date  . Hypertension   . Heart murmur     as a small child   . GERD (gastroesophageal reflux disease)   . Arthritis     Past Surgical History  Procedure Laterality Date  . Left achilles tendon repair     . Right knee meniscus surgery     . Right middle trigger finger surgery       Prescriptions prior to admission  Medication Sig Dispense Refill  . atorvastatin (LIPITOR) 10 MG tablet Take 10 mg by mouth daily.      . hydrochlorothiazide (HYDRODIURIL) 25 MG tablet Take 25 mg by mouth every morning.      Marland Kitchen lisinopril (PRINIVIL,ZESTRIL) 40 MG tablet Take 40 mg by mouth every morning.      . meloxicam (MOBIC) 15 MG tablet Take 15 mg by mouth daily.       No Known Allergies  History  Substance Use Topics  . Smoking status: Never Smoker   . Smokeless tobacco: Never Used   . Alcohol Use: 0.6 - 1.8 oz/week    1-3 Glasses of wine per week     Comment: 1-3 glasses of wine daily     History reviewed. No pertinent family history.   Review of Systems  Musculoskeletal: Positive for joint pain.  All other systems reviewed and are negative.   Objective:  Physical Exam  Constitutional: He is oriented to person, place, and time. He appears well-developed and well-nourished.  HENT:  Head: Normocephalic and atraumatic.  Eyes: EOM are normal. Pupils are equal, round, and reactive to light.  Neck: Normal range of motion. Neck supple.  Cardiovascular: Normal rate and regular rhythm.   Respiratory: Effort normal and breath sounds normal.  GI: Soft. Bowel sounds are normal.  Musculoskeletal:       Left hip: He exhibits decreased range of motion, decreased strength and bony tenderness.  Neurological: He is alert and oriented to person, place, and time.  Skin: Skin is warm and dry.  Psychiatric: He has a normal mood and affect.    Vital signs in last 24 hours: Temp:  [99.2 F (37.3 C)] 99.2 F (37.3 C) (08/28 0549) Pulse Rate:  [89] 89 (08/28 0549) Resp:  [18] 18 (08/28 0549) BP: (151)/(91) 151/91 mmHg (08/28 0549) SpO2:  [98 %] 98 % (08/28 0549) Weight:  [77.111 kg (170 lb)] 77.111 kg (170 lb) (08/28 0617)  Labs:   Estimated body mass index is 25.09 kg/(m^2) as calculated from the following:   Height as of this encounter:  (1.753 m).   Weight as of this encounter: 77.111 kg (170 lb).   Imaging Review Plain radiographs demonstrate severe degenerative joint disease of the left hip(s). The bone quality appears to be good for age and reported activity level.  Assessment/Plan:  End stage arthritis, left hip(s)  The patient history, physical examination, clinical judgement of the provider and imaging studies are consistent with end stage degenerative joint disease of the left hip(s) and total hip arthroplasty is deemed medically necessary. The  treatment options including medical management, injection therapy, arthroscopy and arthroplasty were discussed at length. The risks and benefits of total hip arthroplasty were presented and reviewed. The risks due to aseptic loosening, infection, stiffness, dislocation/subluxation,  thromboembolic complications and other imponderables were discussed.  The patient acknowledged the explanation, agreed to proceed with the plan and consent was signed. Patient is being admitted for inpatient treatment for surgery, pain control, PT, OT, prophylactic antibiotics, VTE prophylaxis, progressive ambulation and ADL's and discharge planning.The patient is planning to be discharged home with home health services

## 2014-02-17 NOTE — Anesthesia Preprocedure Evaluation (Signed)
Anesthesia Evaluation  Patient identified by MRN, date of birth, ID band Patient awake    Reviewed: Allergy & Precautions, H&P , NPO status , Patient's Chart, lab work & pertinent test results  Airway Mallampati: II TM Distance: >3 FB Neck ROM: Full    Dental no notable dental hx.    Pulmonary neg pulmonary ROS,  breath sounds clear to auscultation  Pulmonary exam normal       Cardiovascular hypertension, Pt. on medications + Valvular Problems/Murmurs Rhythm:Regular Rate:Normal     Neuro/Psych negative neurological ROS  negative psych ROS   GI/Hepatic Neg liver ROS, GERD-  ,  Endo/Other  negative endocrine ROS  Renal/GU negative Renal ROS     Musculoskeletal negative musculoskeletal ROS (+)   Abdominal   Peds  Hematology negative hematology ROS (+)   Anesthesia Other Findings   Reproductive/Obstetrics negative OB ROS                           Anesthesia Physical Anesthesia Plan  ASA: II  Anesthesia Plan: Spinal   Post-op Pain Management:    Induction: Intravenous  Airway Management Planned:   Additional Equipment:   Intra-op Plan:   Post-operative Plan:   Informed Consent: I have reviewed the patients History and Physical, chart, labs and discussed the procedure including the risks, benefits and alternatives for the proposed anesthesia with the patient or authorized representative who has indicated his/her understanding and acceptance.   Dental advisory given  Plan Discussed with: CRNA  Anesthesia Plan Comments:         Anesthesia Quick Evaluation

## 2014-02-17 NOTE — Anesthesia Postprocedure Evaluation (Signed)
Anesthesia Post Note  Patient: Colton Calderon  Procedure(s) Performed: Procedure(s) (LRB): LEFT TOTAL HIP ARTHROPLASTY ANTERIOR APPROACH (Left)  Anesthesia type: Spinal  Patient location: PACU  Post pain: Pain level controlled  Post assessment: Post-op Vital signs reviewed  Last Vitals: BP 140/76  Pulse 74  Temp(Src) 36.8 C (Oral)  Resp 14  Ht  (1.753 m)  Wt 170 lb (77.111 kg)  BMI 25.09 kg/m2  SpO2 99%  Post vital signs: Reviewed  Level of consciousness: sedated  Complications: No apparent anesthesia complications

## 2014-02-17 NOTE — Brief Op Note (Signed)
02/17/2014  8:49 AM  PATIENT:  Colton Calderon  68 y.o. male  PRE-OPERATIVE DIAGNOSIS:  Osteoarthritis left hip  POST-OPERATIVE DIAGNOSIS:  Osteoarthritis left hip  PROCEDURE:  Procedure(s): LEFT TOTAL HIP ARTHROPLASTY ANTERIOR APPROACH (Left)  SURGEON:  Surgeon(s) and Role:    * Kathryne Hitch, MD - Primary  PHYSICIAN ASSISTANT: Rexene Edison, PA-C  ANESTHESIA:   spinal  EBL:  Total I/O In: 1000 [I.V.:1000] Out: 450 [Urine:250; Blood:200]  BLOOD ADMINISTERED:none  DRAINS: none   LOCAL MEDICATIONS USED:  NONE  SPECIMEN:  No Specimen  DISPOSITION OF SPECIMEN:  N/A  COUNTS:  YES  TOURNIQUET:  * No tourniquets in log *  DICTATION: .Other Dictation: Dictation Number [id number]  PLAN OF CARE: Admit to inpatient   PATIENT DISPOSITION:  PACU - hemodynamically stable.   Delay start of Pharmacological VTE agent (>24hrs) due to surgical blood loss or risk of bleeding: no

## 2014-02-18 LAB — BASIC METABOLIC PANEL
Anion gap: 12 (ref 5–15)
BUN: 17 mg/dL (ref 6–23)
CO2: 25 mEq/L (ref 19–32)
Calcium: 8.7 mg/dL (ref 8.4–10.5)
Chloride: 101 mEq/L (ref 96–112)
Creatinine, Ser: 0.95 mg/dL (ref 0.50–1.35)
GFR calc Af Amer: >90 mL/min (ref >90)
GFR, EST NON AFRICAN AMERICAN: 84 mL/min — AB (ref >90)
GLUCOSE: 206 mg/dL — AB (ref 70–99)
POTASSIUM: 4.2 meq/L (ref 3.7–5.3)
SODIUM: 138 meq/L (ref 137–147)

## 2014-02-18 LAB — CBC
HCT: 37.3 % — ABNORMAL LOW (ref 39.0–52.0)
Hemoglobin: 12.7 g/dL — ABNORMAL LOW (ref 13.0–17.0)
MCH: 32.4 pg (ref 26.0–34.0)
MCHC: 34 g/dL (ref 30.0–36.0)
MCV: 95.2 fL (ref 78.0–100.0)
Platelets: 192 10*3/uL (ref 150–400)
RBC: 3.92 MIL/uL — AB (ref 4.22–5.81)
RDW: 13.2 % (ref 11.5–15.5)
WBC: 10.2 10*3/uL (ref 4.0–10.5)

## 2014-02-18 MED ORDER — ASPIRIN 325 MG PO TBEC: 325.0000 mg | DELAYED_RELEASE_TABLET | Freq: Two times a day (BID) | ORAL | Status: DC

## 2014-02-18 MED ORDER — OXYCODONE-ACETAMINOPHEN 5-325 MG PO TABS: 1.0000 | ORAL_TABLET | ORAL | Status: DC | PRN

## 2014-02-18 NOTE — Evaluation (Signed)
Occupational Therapy Evaluation Patient Details Name: Colton Calderon MRN: [id number] DOB: 10/31/1945 Today's Date: 2014/03/12    History of Present Illness L THR   Clinical Impression   Pt presents to OT s/p THA- anterior. Education complete regarding ADL activity s/p THA.  Wife will A as needed   Follow Up Recommendations  No OT follow up    Equipment Recommendations  None recommended by OT       Precautions / Restrictions Precautions Precautions: Fall Restrictions Weight Bearing Restrictions: No      Mobility Bed Mobility               General bed mobility comments: pt in chair  Transfers Overall transfer level: Needs assistance Equipment used: Rolling walker (2 wheeled) Transfers: Sit to/from Stand Sit to Stand: Supervision         General transfer comment: cues for LE management and use of UEs to self assist         ADL Overall ADL's : Needs assistance/impaired                     Lower Body Dressing: Minimal assistance;Sit to/from stand   Toilet Transfer: Supervision/safety;Ambulation;RW   Toileting- Clothing Manipulation and Hygiene: Supervision/safety;Sit to/from stand   Tub/ Shower Transfer: Supervision/safety;Walk-in shower   Functional mobility during ADLs: Supervision/safety General ADL Comments: verbal cues for safety and to slow down               Pertinent Vitals/Pain Pain Score: 3  Pain Location: L hip Pain Descriptors / Indicators: Sore     Hand Dominance Right   Extremity/Trunk Assessment Upper Extremity Assessment Upper Extremity Assessment: Overall WFL for tasks assessed           Communication Communication Communication: No difficulties   Cognition Arousal/Alertness: Awake/alert Behavior During Therapy: WFL for tasks assessed/performed Overall Cognitive Status: Within Functional Limits for tasks assessed                                Home Living Family/patient expects to be  discharged to:: Private residence Living Arrangements: Spouse/significant other Available Help at Discharge: Family Type of Home: House Home Access: Stairs to enter Secretary/administrator of Steps: 4 Entrance Stairs-Rails: Left Home Layout: One level     Bathroom Shower/Tub: Producer, television/film/video: Standard     Home Equipment: Environmental consultant - 2 wheels;Cane - quad;Crutches          Prior Functioning/Environment Level of Independence: Independent                      OT Goals(Current goals can be found in the care plan section) Acute Rehab OT Goals Patient Stated Goal: Wax my car   OT Frequency:                End of Session Equipment Utilized During Treatment: Rolling walker Nurse Communication: Mobility status  Activity Tolerance: Patient tolerated treatment well Patient left: in chair;with call bell/phone within reach   Time: 4098-1191 OT Time Calculation (min): 17 min Charges:  OT General Charges $OT Visit: 1 Procedure OT Evaluation $Initial OT Evaluation Tier I: 1 Procedure OT Treatments $Self Care/Home Management : 8-22 mins G-Codes:    Einar Crow D 03/12/2014, 3:50 PM

## 2014-02-18 NOTE — Progress Notes (Signed)
Physical Therapy Treatment Patient Details Name: Colton Calderon MRN: [id number] DOB: 1946-05-23 Today's Date: 03-08-2014    History of Present Illness L THR    PT Comments    Marked improvement in activity tolerance with no c/o dizziness or nausea  Follow Up Recommendations  Home health PT     Equipment Recommendations  None recommended by PT    Recommendations for Other Services OT consult     Precautions / Restrictions Precautions Precautions: Fall Restrictions Weight Bearing Restrictions: No Other Position/Activity Restrictions: WBAT    Mobility  Bed Mobility                  Transfers Overall transfer level: Needs assistance Equipment used: Rolling walker (2 wheeled) Transfers: Sit to/from Stand Sit to Stand: Min assist         General transfer comment: cues for LE management and use of UEs to self assist  Ambulation/Gait Ambulation/Gait assistance: Min assist Ambulation Distance (Feet): 222 Feet Assistive device: Rolling walker (2 wheeled) Gait Pattern/deviations: Step-to pattern;Step-through pattern;Decreased step length - right;Decreased step length - left;Shuffle;Trunk flexed     General Gait Details: cues for posture, position from RW and sequence   Stairs            Wheelchair Mobility    Modified Rankin (Stroke Patients Only)       Balance                                    Cognition Arousal/Alertness: Awake/alert Behavior During Therapy: WFL for tasks assessed/performed Overall Cognitive Status: Within Functional Limits for tasks assessed                      Exercises Total Joint Exercises Ankle Circles/Pumps: AROM;Both;10 reps;Supine Heel Slides: AAROM;Supine;Left;20 reps Hip ABduction/ADduction: AAROM;Left;Supine;20 reps Long Arc Quad: AROM;Both;15 reps;Seated    General Comments        Pertinent Vitals/Pain Pain Assessment: 0-10 Pain Score: 3  Pain Location: L hip Pain Descriptors  / Indicators: Aching Pain Intervention(s): Limited activity within patient's tolerance;Monitored during session;Premedicated before session;Ice applied    Home Living                      Prior Function            PT Goals (current goals can now be found in the care plan section) Acute Rehab PT Goals Patient Stated Goal: Wax my car  PT Goal Formulation: With patient Time For Goal Achievement: 02/25/14 Potential to Achieve Goals: Good Progress towards PT goals: Progressing toward goals    Frequency  7X/week    PT Plan Current plan remains appropriate    Co-evaluation             End of Session Equipment Utilized During Treatment: Gait belt Activity Tolerance: Patient tolerated treatment well;Other (comment) Patient left: in chair;with call bell/phone within reach     Time: 4098-1191 PT Time Calculation (min): 26 min  Charges:  $Gait Training: 8-22 mins $Therapeutic Exercise: 8-22 mins                    G Codes:      Colton Calderon 03/08/2014, 1:17 PM

## 2014-02-18 NOTE — Progress Notes (Signed)
Physical Therapy Treatment Patient Details Name: Colton Calderon MRN: [id number] DOB: [birth year] Today's Date: 02/21/2014    History of Present Illness L THR    PT Comments    Progressing well.  Spouse present, reviewed stairs, spouse and pt with multiple questions asked and answered.  Follow Up Recommendations  Home health PT     Equipment Recommendations  None recommended by PT    Recommendations for Other Services OT consult     Precautions / Restrictions Precautions Precautions: Fall Restrictions Weight Bearing Restrictions: No Other Position/Activity Restrictions: WBAT    Mobility  Bed Mobility               General bed mobility comments: pt in chair  Transfers Overall transfer level: Needs assistance Equipment used: Rolling walker (2 wheeled) Transfers: Sit to/from Stand Sit to Stand: Supervision         General transfer comment: cues for LE management and use of UEs to self assist  Ambulation/Gait Ambulation/Gait assistance: Min guard Ambulation Distance (Feet): 200 Feet Assistive device: Rolling walker (2 wheeled) Gait Pattern/deviations: Step-to pattern;Step-through pattern;Decreased step length - right;Decreased step length - left;Shuffle;Trunk flexed     General Gait Details: cues for posture, position from RW and sequence   Stairs Stairs: Yes Stairs assistance: Min assist Stair Management: No rails;Step to pattern;Backwards;With walker Number of Stairs: 4 General stair comments: cues for sequence and foot/RW placement  Wheelchair Mobility    Modified Rankin (Stroke Patients Only)       Balance                                    Cognition Arousal/Alertness: Awake/alert Behavior During Therapy: WFL for tasks assessed/performed Overall Cognitive Status: Within Functional Limits for tasks assessed                      Exercises      General Comments        Pertinent Vitals/Pain Pain Assessment:  0-10 Pain Score: 3  Pain Location: L hip Pain Descriptors / Indicators: Sore Pain Intervention(s): Limited activity within patient's tolerance;Monitored during session;Premedicated before session;Ice applied    Home Living Family/patient expects to be discharged to:: Private residence Living Arrangements: Spouse/significant other Available Help at Discharge: Family Type of Home: House Home Access: Stairs to enter Entrance Stairs-Rails: Left Home Layout: One level Home Equipment: Environmental consultant - 2 wheels;Cane - quad;Crutches      Prior Function Level of Independence: Independent          PT Goals (current goals can now be found in the care plan section) Acute Rehab PT Goals Patient Stated Goal: Wax my car  PT Goal Formulation: With patient Time For Goal Achievement: 02/25/14 Potential to Achieve Goals: Good Progress towards PT goals: Progressing toward goals    Frequency  7X/week    PT Plan Current plan remains appropriate    Co-evaluation             End of Session Equipment Utilized During Treatment: Gait belt Activity Tolerance: Patient tolerated treatment well Patient left: in chair;with call bell/phone within reach;with family/visitor present     Time: 4098-1191 PT Time Calculation (min): 23 min  Charges:  $Gait Training: 8-22 mins $Therapeutic Activity: 8-22 mins                    G Codes:      Aneya Daddona February 21, 2014,  5:23 PM

## 2014-02-18 NOTE — Progress Notes (Signed)
   Subjective:  Patient reports pain as mild.  No events.  Doing very well  Objective:   VITALS:   Filed Vitals:   02/17/14 1753 02/17/14 2000 02/17/14 2259 02/18/14 0203  BP: 131/73  138/71 118/59  Pulse: 71 68 67 70  Temp: 98 F (36.7 C)  97.7 F (36.5 C) 98.7 F (37.1 C)  TempSrc: Oral  Oral Oral  Resp: Height:      Weight:      SpO2: 100% 98% 97% 96%    Neurologically intact Neurovascular intact Sensation intact distally Intact pulses distally Dorsiflexion/Plantar flexion intact Incision: dressing C/D/I and no drainage No cellulitis present Compartment soft   Lab Results  Component Value Date   WBC 10.2 02/18/2014   HGB 12.7* 02/18/2014   HCT 37.3* 02/18/2014   MCV 95.2 02/18/2014   PLT 192 02/18/2014     Assessment/Plan:  1 Day Post-Op   - Expected postop acute blood loss anemia - will monitor for symptoms - Up with PT/OT - DVT ppx - SCDs, ambulation, asa - WBAT operative extremity - Pain control - Discharge planning - likely home tomorrow  Cheral Almas 02/18/2014, 6:23 AM [phone]

## 2014-02-18 NOTE — Discharge Instructions (Signed)
Increase your activities as comfort allows. You can get your current dressing wet daily in the shower. You can remove your current dressing in one week and start getting your actual incision wet; try to leave the steri-strips in place. Place a new dry dressing daily starting in one week. Expect thigh and leg swelling. Expect some knee pain.

## 2014-02-19 LAB — CBC
HCT: 35.2 % — ABNORMAL LOW (ref 39.0–52.0)
HEMOGLOBIN: 12.3 g/dL — AB (ref 13.0–17.0)
MCH: 32.9 pg (ref 26.0–34.0)
MCHC: 34.9 g/dL (ref 30.0–36.0)
MCV: 94.1 fL (ref 78.0–100.0)
Platelets: 196 10*3/uL (ref 150–400)
RBC: 3.74 MIL/uL — AB (ref 4.22–5.81)
RDW: 13.5 % (ref 11.5–15.5)
WBC: 9 10*3/uL (ref 4.0–10.5)

## 2014-02-19 MED ORDER — METHOCARBAMOL 500 MG PO TABS: 500.0000 mg | ORAL_TABLET | Freq: Four times a day (QID) | ORAL | Status: AC | PRN

## 2014-02-19 NOTE — Progress Notes (Signed)
Patient had discharge to home order.  Discharge paperwork reviewed with patient and wife.  Both verbalized understanding.  Prescriptions given to patient's wife.  Verified patient has all needed equipment at home and [facility] set up.  Patient escorted from unit via wheelchair by NT.  Dorothyann Peng RN

## 2014-02-19 NOTE — Progress Notes (Signed)
Physical Therapy Treatment Patient Details Name: Colton Calderon MRN: [id number] DOB: 09/13/1945 Today's Date: 02/19/2014    History of Present Illness L THR    PT Comments    Reviewed stairs and car transfers with pt and spouse.  Follow Up Recommendations  Home health PT     Equipment Recommendations  None recommended by PT    Recommendations for Other Services OT consult     Precautions / Restrictions Precautions Precautions: Fall Restrictions Weight Bearing Restrictions: No Other Position/Activity Restrictions: WBAT    Mobility  Bed Mobility Overal bed mobility: Needs Assistance Bed Mobility: Supine to Sit;Sit to Supine     Supine to sit: Supervision Sit to supine: Min guard   General bed mobility comments: pt self assisting L LE with R LE  Transfers Overall transfer level: Needs assistance Equipment used: Rolling walker (2 wheeled) Transfers: Sit to/from Stand Sit to Stand: Supervision         General transfer comment: cues for use of UEs to self assist  Ambulation/Gait Ambulation/Gait assistance: Supervision Ambulation Distance (Feet): 150 Feet Assistive device: Rolling walker (2 wheeled) Gait Pattern/deviations: Step-to pattern;Step-through pattern;Decreased step length - right;Decreased step length - left;Shuffle;Trunk flexed     General Gait Details: cues for posture, position from RW and sequence   Stairs Stairs: Yes Stairs assistance: Min assist Stair Management: No rails;Step to pattern;Backwards;With walker Number of Stairs: 4 General stair comments: cues for sequence and foot/RW placement; 2 steps twice with spouse assisting on second attempt  Wheelchair Mobility    Modified Rankin (Stroke Patients Only)       Balance                                    Cognition Arousal/Alertness: Awake/alert Behavior During Therapy: WFL for tasks assessed/performed Overall Cognitive Status: Within Functional Limits for tasks  assessed                      Exercises Total Joint Exercises Ankle Circles/Pumps: AROM;Both;10 reps;Supine Quad Sets: AROM;Both;10 reps;Supine Gluteal Sets: AROM;Both;10 reps;Supine Heel Slides: AAROM;Supine;Left;20 reps Hip ABduction/ADduction: AAROM;Left;Supine;20 reps    General Comments        Pertinent Vitals/Pain Pain Assessment: 0-10 Pain Score: 3  Pain Location: L hip Pain Descriptors / Indicators: Aching;Burning Pain Intervention(s): Limited activity within patient's tolerance;Monitored during session;Premedicated before session;Ice applied    Home Living                      Prior Function            PT Goals (current goals can now be found in the care plan section) Acute Rehab PT Goals Patient Stated Goal: Wax my car  PT Goal Formulation: With patient Time For Goal Achievement: 02/25/14 Potential to Achieve Goals: Good Progress towards PT goals: Progressing toward goals    Frequency  7X/week    PT Plan Current plan remains appropriate    Co-evaluation             End of Session   Activity Tolerance: Patient tolerated treatment well Patient left: in chair;with call bell/phone within reach;with family/visitor present     Time: 1029-1110 PT Time Calculation (min): 41 min  Charges:  $Gait Training: 8-22 mins $Therapeutic Exercise: 8-22 mins $Therapeutic Activity: 8-22 mins  G Codes:      Monterio Bob 03-Mar-2014, 1:49 PM

## 2014-02-19 NOTE — Progress Notes (Signed)
   Subjective:  Patient reports he wants to go home today.  Objective:   VITALS:   Filed Vitals:   02/18/14 1348 02/18/14 2000 02/18/14 2224 02/19/14 0540  BP: 130/71  142/74 148/52  Pulse: 70  72 74  Temp: 98.2 F (36.8 C)  98 F (36.7 C) 98.2 F (36.8 C)  TempSrc: Oral  Oral Oral  Resp: Height:      Weight:      SpO2: 100% 100% 100% 100%    Exam stable   Lab Results  Component Value Date   WBC 9.0 02/19/2014   HGB 12.3* 02/19/2014   HCT 35.2* 02/19/2014   MCV 94.1 02/19/2014   PLT 196 02/19/2014     Assessment/Plan:  2 Days Post-Op   - home today  Cheral Almas 02/19/2014, 12:16 PM [phone]

## 2014-02-19 NOTE — Discharge Summary (Signed)
Physician Discharge Summary      Patient ID: Colton Calderon MRN: [id number] DOB/AGE: Jun 11, 1946 68 y.o.  Admit date: 02/17/2014 Discharge date: 02/19/2014  Admission Diagnoses:  Arthritis of left hip  Discharge Diagnoses:  Principal Problem:   Arthritis of left hip Active Problems:   Status post total replacement of left hip   Past Medical History  Diagnosis Date  . Hypertension   . Heart murmur     as a small child   . GERD (gastroesophageal reflux disease)   . Arthritis     Surgeries: Procedure(s): LEFT TOTAL HIP ARTHROPLASTY ANTERIOR APPROACH on 02/17/2014   Consultants (if any):    Discharged Condition: Improved  Hospital Course: Colton Calderon is an 68 y.o. male who was admitted 02/17/2014 with a diagnosis of Arthritis of left hip and went to the operating room on 02/17/2014 and underwent the above named procedures.    He was given perioperative antibiotics:      Anti-infectives   Start     Dose/Rate Route Frequency Ordered Stop   02/17/14 1300  ceFAZolin (ANCEF) IVPB 1 g/50 mL premix     1 g 100 mL/hr over 30 Minutes Intravenous Every 6 hours 02/17/14 1051 02/17/14 2040   02/17/14 0615  ceFAZolin (ANCEF) IVPB 2 g/50 mL premix     2 g 100 mL/hr over 30 Minutes Intravenous On call to O.R. 02/17/14 4098 02/17/14 0715    .  He was given sequential compression devices, early ambulation, and aspirin for DVT prophylaxis.  He benefited maximally from the hospital stay and there were no complications.    Recent vital signs:  Filed Vitals:   02/19/14 0540  BP: 148/52  Pulse: 74  Temp: 98.2 F (36.8 C)  Resp: 18    Recent laboratory studies:  Lab Results  Component Value Date   HGB 12.3* 02/19/2014   HGB 12.7* 02/18/2014   HGB 14.8 02/09/2014   Lab Results  Component Value Date   WBC 9.0 02/19/2014   PLT 196 02/19/2014   Lab Results  Component Value Date   INR 1.03 02/09/2014   Lab Results  Component Value Date   NA 138 02/18/2014   K 4.2 02/18/2014     CL 101 02/18/2014   CO2 25 02/18/2014   BUN 17 02/18/2014   CREATININE 0.95 02/18/2014   GLUCOSE 206* 02/18/2014    Discharge Medications:     Medication List    STOP taking these medications       meloxicam 15 MG tablet  Commonly known as:  MOBIC      TAKE these medications       aspirin 325 MG EC tablet  Take 1 tablet (325 mg total) by mouth 2 (two) times daily after a meal.     atorvastatin 10 MG tablet  Commonly known as:  LIPITOR  Take 10 mg by mouth daily.     hydrochlorothiazide 25 MG tablet  Commonly known as:  HYDRODIURIL  Take 25 mg by mouth every morning.     lisinopril 40 MG tablet  Commonly known as:  PRINIVIL,ZESTRIL  Take 40 mg by mouth every morning.     methocarbamol 500 MG tablet  Commonly known as:  ROBAXIN  Take 1 tablet (500 mg total) by mouth every 6 (six) hours as needed for muscle spasms.     oxyCODONE-acetaminophen 5-325 MG per tablet  Commonly known as:  ROXICET  Take 1-2 tablets by mouth every 4 (four) hours as needed.  Diagnostic Studies: Dg Chest 2 View  02/09/2014   CLINICAL DATA:  68 year old male preoperative study. Hypertension. Initial encounter.  EXAM: CHEST  2 VIEW  COMPARISON:  08/29/2009.  FINDINGS: Stable lung volumes, within normal limits. Normal cardiac size and mediastinal contours. Visualized tracheal air column is within normal limits. No pneumothorax, pulmonary edema, pleural effusion or confluent pulmonary opacity. Aortic calcified atherosclerosis. No acute osseous abnormality identified.  IMPRESSION: No acute cardiopulmonary abnormality.   Electronically Signed   By: Augusto Gamble M.D.   On: 02/09/2014 12:11   Dg Hip Complete Left  02/17/2014   CLINICAL DATA:  Status post total hip replacement  EXAM: LEFT HIP - COMPLETE 1 VIEW  COMPARISON:  None.  FINDINGS: Frontal view obtained. There is a total hip prosthesis on the left which appears well seated. No fracture or dislocation appreciable on this single view.  IMPRESSION:  Total hip prosthesis appears well-seated on single view.   Electronically Signed   By: Bretta Bang M.D.   On: 02/17/2014 08:52   Dg Pelvis Portable  02/17/2014   CLINICAL DATA:  Postop left hip replacement  EXAM: PORTABLE PELVIS 1-2 VIEWS  COMPARISON:  None.  FINDINGS: Total left hip replacement with femoral and acetabular components in anticipated position. Anticipated soft tissue postoperative change surrounding the proximal left femur.  IMPRESSION: Postoperative change   Electronically Signed   By: Esperanza Heir M.D.   On: 02/17/2014 09:50   Dg Hip Portable 1 View Left  02/17/2014   CLINICAL DATA:  Postoperative evaluation.  EXAM: DG C-ARM 1-60 MIN - [facility]; PORTABLE LEFT HIP - 1 VIEW  COMPARISON:  02/17/2014  FINDINGS: Single cross-table lateral view demonstrates total left hip prosthesis. No definite evidence for acute fracture.  IMPRESSION: Total left hip prosthesis without definite evidence for associated fracture on single view.   Electronically Signed   By: Annia Belt M.D.   On: 02/17/2014 09:57   Dg C-arm 1-60 Min-no Report  02/17/2014   CLINICAL DATA:  Postoperative evaluation.  EXAM: DG C-ARM 1-60 MIN - [facility]; PORTABLE LEFT HIP - 1 VIEW  COMPARISON:  02/17/2014  FINDINGS: Single cross-table lateral view demonstrates total left hip prosthesis. No definite evidence for acute fracture.  IMPRESSION: Total left hip prosthesis without definite evidence for associated fracture on single view.   Electronically Signed   By: Annia Belt M.D.   On: 02/17/2014 09:57    Disposition: 01-Home or Self Care  Discharge Instructions   Call MD / Call 911    Complete by:  As directed   If you experience chest pain or shortness of breath, CALL 911 and be transported to the hospital emergency room.  If you develope a fever above 101.5 F, pus (white drainage) or increased drainage or redness at the wound, or calf pain, call your surgeon's office.     Constipation Prevention    Complete by:  As  directed   Drink plenty of fluids.  Prune juice may be helpful.  You may use a stool softener, such as Colace (over the counter) 100 mg twice a day.  Use MiraLax (over the counter) for constipation as needed.     Diet - low sodium heart healthy    Complete by:  As directed      Diet general    Complete by:  As directed      Driving restrictions    Complete by:  As directed   No driving while taking narcotic pain meds.  Follow the hip precautions as taught in Physical Therapy    Complete by:  As directed      Increase activity slowly as tolerated    Complete by:  As directed      TED hose    Complete by:  As directed   Use stockings (TED hose) for 6 weeks on both leg(s).  You may remove them at night for sleeping.           Follow-up Information   Follow up with Kathryne Hitch, MD In 2 weeks.   Specialty:  Orthopedic Surgery   Contact information:   [address] [place] [address] [phone]       Follow up with [facility]. [facility])    Contact information:   [address] [place] [address] [phone]        Signed: Cheral Almas 02/19/2014, 4:55 PM

## 2014-05-24 ENCOUNTER — Other Ambulatory Visit (HOSPITAL_COMMUNITY): Payer: Self-pay | Admitting: Orthopaedic Surgery

## 2014-06-01 ENCOUNTER — Encounter (HOSPITAL_COMMUNITY)
Admission: RE | Admit: 2014-06-01 | Discharge: 2014-06-01 | Disposition: A | Discharge status: Home / Self Care | Payer: 59 | Source: Ambulatory Visit | Attending: Orthopaedic Surgery | Admitting: Orthopaedic Surgery

## 2014-06-01 ENCOUNTER — Encounter (HOSPITAL_COMMUNITY): Payer: Self-pay

## 2014-06-01 DIAGNOSIS — Z01812 Encounter for preprocedural laboratory examination: Secondary | ICD-10-CM | POA: Diagnosis not present

## 2014-06-01 LAB — COMPREHENSIVE METABOLIC PANEL
ALBUMIN: 3.9 g/dL (ref 3.5–5.2)
ALT: 28 U/L (ref 0–53)
AST: 22 U/L (ref 0–37)
Alkaline Phosphatase: 64 U/L (ref 39–117)
Anion gap: 13 (ref 5–15)
BILIRUBIN TOTAL: 0.4 mg/dL (ref 0.3–1.2)
BUN: 20 mg/dL (ref 6–23)
CHLORIDE: 102 meq/L (ref 96–112)
CO2: 25 mEq/L (ref 19–32)
CREATININE: 0.89 mg/dL (ref 0.50–1.35)
Calcium: 9.3 mg/dL (ref 8.4–10.5)
GFR calc Af Amer: >90 mL/min (ref >90)
GFR calc non Af Amer: 86 mL/min — ABNORMAL LOW (ref >90)
Glucose, Bld: 105 mg/dL — ABNORMAL HIGH (ref 70–99)
Potassium: 4.1 mEq/L (ref 3.7–5.3)
Sodium: 140 mEq/L (ref 137–147)
Total Protein: 7 g/dL (ref 6.0–8.3)

## 2014-06-01 LAB — CBC
HCT: 42.8 % (ref 39.0–52.0)
Hemoglobin: 14.4 g/dL (ref 13.0–17.0)
MCH: 31.6 pg (ref 26.0–34.0)
MCHC: 33.6 g/dL (ref 30.0–36.0)
MCV: 94.1 fL (ref 78.0–100.0)
Platelets: 201 10*3/uL (ref 150–400)
RBC: 4.55 MIL/uL (ref 4.22–5.81)
RDW: 13.6 % (ref 11.5–15.5)
WBC: 6.3 10*3/uL (ref 4.0–10.5)

## 2014-06-01 NOTE — Pre-Procedure Instructions (Signed)
Colton LudwigCraig S Calderon  06/01/2014   Your procedure is scheduled on:  Tuesday, December 15th  [facility] at 1:15[facility].  Call this number if you have problems the morning of surgery: [phone]   Remember:   Do not eat food or drink liquids after midnight.   Take these medicines the morning of surgery with A SIP OF WATER: oxycodone if needed   Do not wear jewelry.  Do not wear lotions, powders, or perfumes. You may wear deodorant.  Do not shave 48 hours prior to surgery. Men may shave face and neck.  Do not bring valuables to the hospital.  [facility] is not responsible  for any belongings or valuables.               Contacts, dentures or bridgework may not be worn into surgery.  Leave suitcase in the car. After surgery it may be brought to your room.  For patients admitted to the hospital, discharge time is determined by your  treatment team.               Patients discharged the day of surgery will not be allowed to drive home.  Please read over the following fact sheets that you were given: Pain Booklet, Coughing and Deep Breathing and Surgical Site Infection Prevention  [facility] - Preparing for Surgery  Before surgery, you can play an important role.  Because skin is not sterile, your skin needs to be as free of germs as possible.  You can reduce the number of germs on you skin by washing with CHG (chlorahexidine gluconate) soap before surgery.  CHG is an antiseptic cleaner which kills germs and bonds with the skin to continue killing germs even after washing.  Please DO NOT use if you have an allergy to CHG or antibacterial soaps.  If your skin becomes reddened/irritated stop using the CHG and inform your nurse when you arrive at Short Stay.  Do not shave (including legs and underarms) for at least 48 hours prior to the first CHG shower.  You may shave your face.  Please follow these instructions carefully:   1.  Shower with CHG Soap the night before  surgery and the morning of Surgery.  2.  If you choose to wash your hair, wash your hair first as usual with your normal shampoo.  3.  After you shampoo, rinse your hair and body thoroughly to remove the shampoo.  4.  Use CHG as you would any other liquid soap.  You can apply CHG directly to the skin and wash gently with scrungie or a clean washcloth.  5.  Apply the CHG Soap to your body ONLY FROM THE NECK DOWN.  Do not use on open wounds or open sores.  Avoid contact with your eyes, ears, mouth and genitals (private parts).  Wash genitals (private parts) with your normal soap.  6.  Wash thoroughly, paying special attention to the area where your surgery will be performed.  7.  Thoroughly rinse your body with warm water from the neck down.  8.  DO NOT shower/wash with your normal soap after using and rinsing off the CHG Soap.  9.  Pat yourself dry with a clean towel.            10.  Wear clean pajamas.            11.  Place clean sheets on your bed the night of your first shower and do  not sleep with pets.  Day of Surgery  Do not apply any lotions/deoderants the morning of surgery.  Please wear clean clothes to the hospital/surgery center.

## 2014-06-01 NOTE — Progress Notes (Signed)
Primary - dr. Sigmund Hazelburnette No cardiologist ekg in epic from august

## 2014-06-05 MED ORDER — CEFAZOLIN SODIUM-DEXTROSE 2-3 GM-% IV SOLR
2.0000 g | INTRAVENOUS | Status: AC
  Administered 2014-06-06: 2 g via INTRAVENOUS
  Filled 2014-06-05: qty 50

## 2014-06-06 ENCOUNTER — Ambulatory Visit (HOSPITAL_COMMUNITY): Payer: 59 | Admitting: Certified Registered Nurse Anesthetist

## 2014-06-06 ENCOUNTER — Encounter (HOSPITAL_COMMUNITY): Payer: Self-pay | Admitting: Certified Registered Nurse Anesthetist

## 2014-06-06 ENCOUNTER — Encounter (HOSPITAL_COMMUNITY)
Admission: RE | Disposition: A | Discharge status: Home / Self Care | Payer: Self-pay | Source: Ambulatory Visit | Attending: Orthopaedic Surgery

## 2014-06-06 ENCOUNTER — Ambulatory Visit (HOSPITAL_COMMUNITY)
Admission: RE | Admit: 2014-06-06 | Discharge: 2014-06-06 | Disposition: A | Discharge status: Home / Self Care | Payer: 59 | Source: Ambulatory Visit | Attending: Orthopaedic Surgery | Admitting: Orthopaedic Surgery

## 2014-06-06 DIAGNOSIS — M6530 Trigger finger, unspecified finger: Secondary | ICD-10-CM

## 2014-06-06 DIAGNOSIS — I10 Essential (primary) hypertension: Secondary | ICD-10-CM | POA: Diagnosis not present

## 2014-06-06 DIAGNOSIS — Z96649 Presence of unspecified artificial hip joint: Secondary | ICD-10-CM | POA: Insufficient documentation

## 2014-06-06 DIAGNOSIS — K219 Gastro-esophageal reflux disease without esophagitis: Secondary | ICD-10-CM | POA: Insufficient documentation

## 2014-06-06 DIAGNOSIS — M1990 Unspecified osteoarthritis, unspecified site: Secondary | ICD-10-CM | POA: Insufficient documentation

## 2014-06-06 DIAGNOSIS — M65332 Trigger finger, left middle finger: Secondary | ICD-10-CM | POA: Diagnosis not present

## 2014-06-06 HISTORY — PX: TRIGGER FINGER RELEASE: SHX641

## 2014-06-06 SURGERY — RELEASE, A1 PULLEY, FOR TRIGGER FINGER
Anesthesia: Monitor Anesthesia Care | Site: Hand | Laterality: Left

## 2014-06-06 MED ORDER — OXYCODONE HCL 5 MG/5ML PO SOLN: 5.0000 mg | Freq: Once | ORAL | Status: DC | PRN

## 2014-06-06 MED ORDER — BUPIVACAINE HCL 0.25 % IJ SOLN
INTRAMUSCULAR | Status: DC | PRN
  Administered 2014-06-06: 5 mL

## 2014-06-06 MED ORDER — FENTANYL CITRATE 0.05 MG/ML IJ SOLN
INTRAMUSCULAR | Status: AC
  Filled 2014-06-06: qty 5

## 2014-06-06 MED ORDER — MIDAZOLAM HCL 2 MG/2ML IJ SOLN
INTRAMUSCULAR | Status: AC
  Filled 2014-06-06: qty 2

## 2014-06-06 MED ORDER — PROMETHAZINE HCL 25 MG/ML IJ SOLN: 6.2500 mg | INTRAMUSCULAR | Status: DC | PRN

## 2014-06-06 MED ORDER — LIDOCAINE HCL 1 % IJ SOLN
INTRAMUSCULAR | Status: DC | PRN
  Administered 2014-06-06: 10 mL

## 2014-06-06 MED ORDER — HYDROCODONE-ACETAMINOPHEN 5-325 MG PO TABS: 1.0000 | ORAL_TABLET | Freq: Four times a day (QID) | ORAL | Status: AC | PRN

## 2014-06-06 MED ORDER — PROPOFOL 10 MG/ML IV BOLUS
INTRAVENOUS | Status: DC | PRN
  Administered 2014-06-06: 20 mg via INTRAVENOUS
  Administered 2014-06-06: 30 mg via INTRAVENOUS

## 2014-06-06 MED ORDER — LIDOCAINE HCL (PF) 1 % IJ SOLN
INTRAMUSCULAR | Status: AC
  Filled 2014-06-06: qty 30

## 2014-06-06 MED ORDER — BUPIVACAINE HCL (PF) 0.25 % IJ SOLN
INTRAMUSCULAR | Status: AC
  Filled 2014-06-06: qty 30

## 2014-06-06 MED ORDER — OXYCODONE HCL 5 MG PO TABS: 5.0000 mg | ORAL_TABLET | Freq: Once | ORAL | Status: DC | PRN

## 2014-06-06 MED ORDER — LACTATED RINGERS IV SOLN
INTRAVENOUS | Status: DC | PRN
  Administered 2014-06-06: 14:00:00 via INTRAVENOUS

## 2014-06-06 MED ORDER — ONDANSETRON HCL 4 MG/2ML IJ SOLN
INTRAMUSCULAR | Status: DC | PRN
  Administered 2014-06-06: 4 mg via INTRAVENOUS

## 2014-06-06 MED ORDER — FENTANYL CITRATE 0.05 MG/ML IJ SOLN
INTRAMUSCULAR | Status: DC | PRN
  Administered 2014-06-06: 50 ug via INTRAVENOUS

## 2014-06-06 MED ORDER — HYDROMORPHONE HCL 1 MG/ML IJ SOLN: 0.2500 mg | INTRAMUSCULAR | Status: DC | PRN

## 2014-06-06 MED ORDER — MIDAZOLAM HCL 5 MG/5ML IJ SOLN
INTRAMUSCULAR | Status: DC | PRN
Start: 2014-06-06 — End: 2014-06-06
  Administered 2014-06-06: 1 mg via INTRAVENOUS

## 2014-06-06 MED ORDER — LACTATED RINGERS IV SOLN
INTRAVENOUS | Status: DC
  Administered 2014-06-06: 14:00:00 via INTRAVENOUS

## 2014-06-06 MED ORDER — 0.9 % SODIUM CHLORIDE (POUR BTL) OPTIME
TOPICAL | Status: DC | PRN
  Administered 2014-06-06: 1000 mL

## 2014-06-06 SURGICAL SUPPLY — 40 items
BANDAGE ELASTIC 3 VELCRO ST LF (GAUZE/BANDAGES/DRESSINGS) ×1 IMPLANT
BANDAGE ELASTIC 4 VELCRO ST LF (GAUZE/BANDAGES/DRESSINGS) IMPLANT
BNDG COHESIVE 1X5 TAN STRL LF (GAUZE/BANDAGES/DRESSINGS) ×2 IMPLANT
BNDG CONFORM 2 STRL LF (GAUZE/BANDAGES/DRESSINGS) ×2 IMPLANT
BNDG GAUZE ELAST 4 BULKY (GAUZE/BANDAGES/DRESSINGS) IMPLANT
CORDS BIPOLAR (ELECTRODE) ×3 IMPLANT
COVER SURGICAL LIGHT HANDLE (MISCELLANEOUS) ×3 IMPLANT
CUFF TOURNIQUET SINGLE 18IN (TOURNIQUET CUFF) ×3 IMPLANT
CUFF TOURNIQUET SINGLE 24IN (TOURNIQUET CUFF) IMPLANT
DRAPE SURG 17X23 STRL (DRAPES) IMPLANT
DRAPE U-SHAPE 47X51 STRL (DRAPES) IMPLANT
DURAPREP 26ML APPLICATOR (WOUND CARE) ×3 IMPLANT
GAUZE SPONGE 4X4 12PLY STRL (GAUZE/BANDAGES/DRESSINGS) ×3 IMPLANT
GAUZE XEROFORM 1X8 LF (GAUZE/BANDAGES/DRESSINGS) ×3 IMPLANT
GLOVE BIO SURGEON STRL SZ8 (GLOVE) ×3 IMPLANT
GLOVE BIOGEL PI IND STRL 8 (GLOVE) ×2 IMPLANT
GLOVE BIOGEL PI INDICATOR 8 (GLOVE) ×4
GLOVE ORTHO TXT STRL SZ7.5 (GLOVE) ×3 IMPLANT
GOWN STRL REUS W/ TWL LRG LVL3 (GOWN DISPOSABLE) IMPLANT
GOWN STRL REUS W/ TWL XL LVL3 (GOWN DISPOSABLE) ×2 IMPLANT
GOWN STRL REUS W/TWL LRG LVL3 (GOWN DISPOSABLE)
GOWN STRL REUS W/TWL XL LVL3 (GOWN DISPOSABLE) ×3
KIT BASIN OR (CUSTOM PROCEDURE TRAY) ×3 IMPLANT
KIT ROOM TURNOVER OR (KITS) ×1 IMPLANT
NEEDLE 22X1 1/2 (OR ONLY) (NEEDLE) IMPLANT
NS IRRIG 1000ML POUR BTL (IV SOLUTION) ×3 IMPLANT
PACK ORTHO EXTREMITY (CUSTOM PROCEDURE TRAY) ×3 IMPLANT
PAD ARMBOARD 7.5X6 YLW CONV (MISCELLANEOUS) ×2 IMPLANT
PAD CAST 4YDX4 CTTN HI CHSV (CAST SUPPLIES) ×1 IMPLANT
PADDING CAST COTTON 4X4 STRL (CAST SUPPLIES)
SUCTION FRAZIER TIP 10 FR DISP (SUCTIONS) IMPLANT
SUT ETHILON 3 0 FSL (SUTURE) ×2 IMPLANT
SUT ETHILON 3 0 PS 1 (SUTURE) ×1 IMPLANT
SUT ETHILON 4 0 PS 2 18 (SUTURE) ×4 IMPLANT
SYR CONTROL 10ML LL (SYRINGE) ×2 IMPLANT
TOWEL OR 17X26 10 PK STRL BLUE (TOWEL DISPOSABLE) ×3 IMPLANT
TUBE CONNECTING 12'X1/4 (SUCTIONS)
TUBE CONNECTING 12X1/4 (SUCTIONS) IMPLANT
UNDERPAD 30X30 INCONTINENT (UNDERPADS AND DIAPERS) ×1 IMPLANT
WATER STERILE IRR 1000ML POUR (IV SOLUTION) ×1 IMPLANT

## 2014-06-06 NOTE — Transfer of Care (Signed)
Immediate Anesthesia Transfer of Care Note  Patient: Colton Calderon  Procedure(s) Performed: Procedure(s): RELEASE LEFT MIDDLE TRIGGER FINGER (Left)  Patient Location: PACU  Anesthesia Type:MAC  Level of Consciousness: awake, alert  and oriented  Airway & Oxygen Therapy: Patient Spontanous Breathing and Patient connected to nasal cannula oxygen  Post-op Assessment: Report given to PACU RN, Post -op Vital signs reviewed and stable and Patient moving all extremities X 4  Post vital signs: Reviewed and stable  Complications: No apparent anesthesia complications

## 2014-06-06 NOTE — H&P (Signed)
Colton Calderon is an 68 y.o. male.   Chief Complaint:  Left middle finger pain and triggering HPI:   68 yo male with known trigger finger involving his left hand middle finger.  He has had previous successful surgery on his right hand for the same issue.  He now wishes to have a left middle finger A1 pulley release to treat his triggering.  He understands fully the risks of infection, nerve and vessel injury, tendon injury, and risks associated with anesthesia.  Informed consent is obtained.  Past Medical History  Diagnosis Date  . Hypertension   . Heart murmur     as a small child   . GERD (gastroesophageal reflux disease)   . Arthritis     Past Surgical History  Procedure Laterality Date  . Left achilles tendon repair     . Right knee meniscus surgery     . Right middle trigger finger surgery     . Total hip arthroplasty Left 02/17/2014    Procedure: LEFT TOTAL HIP ARTHROPLASTY ANTERIOR APPROACH;  Surgeon: Kathryne Hitchhristopher Y Jaye Saal, MD;  Location: [facility];  Service: Orthopedics;  Laterality: Left;    No family history on file. Social History:  reports that he has never smoked. He has never used smokeless tobacco. He reports that he drinks about 1.2 oz of alcohol per week. He reports that he does not use illicit drugs.  Allergies: No Known Allergies  No prescriptions prior to admission    No results found for this or any previous visit (from the past 48 hour(s)). No results found.  Review of Systems  All other systems reviewed and are negative.   There were no vitals taken for this visit. Physical Exam  Constitutional: He is oriented to person, place, and time. He appears well-developed and well-nourished.  HENT:  Head: Normocephalic and atraumatic.  Eyes: EOM are normal. Pupils are equal, round, and reactive to light.  Neck: Normal range of motion. Neck supple.  Cardiovascular: Normal rate and regular rhythm.   Respiratory: Effort normal and breath sounds normal.  GI: Soft.  Bowel sounds are normal.  Musculoskeletal:       Left hand: He exhibits tenderness.       Hands: Neurological: He is alert and oriented to person, place, and time.  Skin: Skin is warm and dry.  Psychiatric: He has a normal mood and affect.     Assessment/Plan Left hand middle finger trigger finger 1)  To the OR today as an outpatient for a left hand middle finger trigger release of the A1 pulley as an outpatient.  Kathryne HitchBLACKMAN,Bhakti Labella Y 06/06/2014, 12:28 PM

## 2014-06-06 NOTE — Brief Op Note (Signed)
06/06/2014  3:49 PM  PATIENT:  Colton Calderon  68 y.o. male  PRE-OPERATIVE DIAGNOSIS:  Left middle finger trigger finger  POST-OPERATIVE DIAGNOSIS:  Left middle finger trigger finger  PROCEDURE:  Procedure(s): RELEASE LEFT MIDDLE TRIGGER FINGER (Left)  SURGEON:  Surgeon(s) and Role:    * Kathryne Hitchhristopher Y Blackman, MD - Primary  PHYSICIAN ASSISTANT: Rexene EdisonGil clark, PA-C  ANESTHESIA:   local and IV sedation  EBL:   minimal  BLOOD ADMINISTERED:none  DRAINS: none   LOCAL MEDICATIONS USED:  MARCAINE     SPECIMEN:  No Specimen  DISPOSITION OF SPECIMEN:  N/A  COUNTS:  YES  TOURNIQUET:   Total Tourniquet Time Documented: Forearm (Left) - 10 minutes Total: Forearm (Left) - 10 minutes   DICTATION: .Other Dictation: Dictation Number [id number]  PLAN OF CARE: to home from PACU  PATIENT DISPOSITION:  PACU - hemodynamically stable.   Delay start of Pharmacological VTE agent (>24hrs) due to surgical blood loss or risk of bleeding: not applicable

## 2014-06-06 NOTE — Anesthesia Procedure Notes (Signed)
Procedure Name: MAC Date/Time: 06/06/2014 3:25 PM Performed by: Rise PatienceBELL, Jasun Gasparini T Pre-anesthesia Checklist: Patient identified, Emergency Drugs available, Suction available and Patient being monitored Patient Re-evaluated:Patient Re-evaluated prior to inductionOxygen Delivery Method: Simple face mask Preoxygenation: Pre-oxygenation with 100% oxygen Intubation Type: IV induction Placement Confirmation: positive ETCO2 and breath sounds checked- equal and bilateral Dental Injury: Teeth and Oropharynx as per pre-operative assessment

## 2014-06-06 NOTE — Anesthesia Preprocedure Evaluation (Signed)
Anesthesia Evaluation  Patient identified by MRN, date of birth, ID band Patient awake    Reviewed: Allergy & Precautions, NPO status , Patient's Chart, lab work & pertinent test results  Airway Mallampati: II  TM Distance: >3 FB     Dental  (+) Teeth Intact, Dental Advidsory Given   Pulmonary neg pulmonary ROS,  breath sounds clear to auscultation        Cardiovascular hypertension, Rhythm:regular Rate:Normal     Neuro/Psych negative neurological ROS  negative psych ROS   GI/Hepatic Neg liver ROS, GERD-  Controlled,  Endo/Other  negative endocrine ROS  Renal/GU negative Renal ROS     Musculoskeletal   Abdominal   Peds  Hematology   Anesthesia Other Findings   Reproductive/Obstetrics negative OB ROS                             Anesthesia Physical Anesthesia Plan  ASA: II  Anesthesia Plan: MAC   Post-op Pain Management:    Induction:   Airway Management Planned:   Additional Equipment:   Intra-op Plan:   Post-operative Plan:   Informed Consent: I have reviewed the patients History and Physical, chart, labs and discussed the procedure including the risks, benefits and alternatives for the proposed anesthesia with the patient or authorized representative who has indicated his/her understanding and acceptance.   Dental Advisory Given  Plan Discussed with: Anesthesiologist, CRNA and Surgeon  Anesthesia Plan Comments:         Anesthesia Quick Evaluation

## 2014-06-06 NOTE — Anesthesia Postprocedure Evaluation (Signed)
Anesthesia Post Note  Patient: Colton Calderon  Procedure(s) Performed: Procedure(s) (LRB): RELEASE LEFT MIDDLE TRIGGER FINGER (Left)  Anesthesia type: MAC  Patient location: PACU  Post pain: Pain level controlled  Post assessment: Patient's Cardiovascular Status Stable  Last Vitals:  Filed Vitals:   06/06/14 1626  BP: 133/92  Pulse: 65  Temp:   Resp: 15    Post vital signs: Reviewed and stable  Level of consciousness: sedated  Complications: No apparent anesthesia complications

## 2014-06-06 NOTE — Discharge Instructions (Signed)
Increase your activities as comfort allows. Leave your current dressing on and in place for the next 2 days. Keep your dressing clean and dry. Once you remove your dressing in 2 days, you can get your incision wet in the shower daily. Daily band-aids placed over your incision in 2 days.  What to eat:  For your first meals, you should eat lightly; only small meals initially.  If you do not have nausea, you may eat larger meals.  Avoid spicy, greasy and heavy food.    General Anesthesia, Adult, Care After  Refer to this sheet in the next few weeks. These instructions provide you with information on caring for yourself after your procedure. Your health care provider may also give you more specific instructions. Your treatment has been planned according to current medical practices, but problems sometimes occur. Call your health care provider if you have any problems or questions after your procedure.  WHAT TO EXPECT AFTER THE PROCEDURE  After the procedure, it is typical to experience:  Sleepiness.  Nausea and vomiting. HOME CARE INSTRUCTIONS  For the first 24 hours after general anesthesia:  Have a responsible person with you.  Do not drive a car. If you are alone, do not take public transportation.  Do not drink alcohol.  Do not take medicine that has not been prescribed by your health care provider.  Do not sign important papers or make important decisions.  You may resume a normal diet and activities as directed by your health care provider.  Change bandages (dressings) as directed.  If you have questions or problems that seem related to general anesthesia, call the hospital and ask for the anesthetist or anesthesiologist on call. SEEK MEDICAL CARE IF:  You have nausea and vomiting that continue the day after anesthesia.  You develop a rash. SEEK IMMEDIATE MEDICAL CARE IF:  You have difficulty breathing.  You have chest pain.  You have any allergic problems. Document Released:  09/15/2000 Document Revised: 02/09/2013 Document Reviewed: 12/23/2012  [facility] Patient Information 2014 [place].

## 2014-06-07 ENCOUNTER — Encounter (HOSPITAL_COMMUNITY): Payer: Self-pay | Admitting: Orthopaedic Surgery

## 2014-06-07 NOTE — Op Note (Signed)
NAMEmeterio Reeve:  Bollier, Matai                 ACCOUNT NO.:  [id number]  MEDICAL RECORD NO.:  [id number]  LOCATION:  [facility]                         FACILITY:  [facility]  PHYSICIAN:  Vanita PandaChristopher Y. Magnus IvanBlackman, M.D.DATE OF BIRTH:  Jun 17, 1946  DATE OF PROCEDURE:  06/06/2014 DATE OF DISCHARGE:  06/06/2014                              OPERATIVE REPORT   PREOPERATIVE DIAGNOSIS:  Left middle finger trigger finger with hypertrophic A1 pulley.  POSTOPERATIVE DIAGNOSIS:  Left middle finger trigger finger with hypertrophic A1 pulley.  PROCEDURE:  Left middle finger A1 pulley release.  SURGEON:  Vanita PandaChristopher Y. Magnus IvanBlackman, M.D.  ASSISTANT:  Richardean CanalGilbert Clark, PA-C  ANESTHESIA: 1. Mask ventilation and IV sedation. 2. Local with first a 5 mL of 1% plain lidocaine followed by 3 mL of     0.25% plain Marcaine.  TOURNIQUET TIME:  Less than 10 minutes.  BLOOD LOSS:  Minimal.  COMPLICATIONS:  None.  INDICATIONS:  Mr. Melvyn NethLewis is well known to me.  He is a 68 year old gentleman who has had trigger nerve digits on his right hand and successful A1 pulley releases on the right side.  He has had left middle finger triggering with pain over the left middle finger A1 pulley.  At this point, he wished to proceed with A1 pulley release on this side. He understands the risks and benefits of surgery and does wish to proceed.  PROCEDURE DESCRIPTION:  After informed consent was obtained, appropriate left hand middle finger was marked.  He was brought to the operating room and remained on a stretcher.  Mask ventilation and IV sedation was obtained.  His left hand was prepped and draped with DuraPrep and sterile drapes and a forearm tourniquet was placed as well.  Once adequate anesthesia was obtained, time-out was called to identify correct patient, correct left extremity.  We had the tourniquet inflated to 250 mm of pressure and anesthetized his left hand over the A1 pulley with 1% plain lidocaine.  I then made an incision over  the A1 pulley and dissected down the pulley and released the pulley in its entirety and watched the tendons glide back support with flexion and extension of his index finger.  I then irrigated the soft tissue with normal saline solution and reapproximated the skin edges with interrupted 3-0 nylon suture.  We then infiltrated the incision again with 0.25% plain Marcaine.  Xeroform and well-padded sterile dressings applied.  His tourniquet was let down.  His fingers pinked nicely.  He was taken to the recovery room in stable condition.  All final counts were correct and no complications noted.     Vanita Pandahristopher Y. Magnus IvanBlackman, M.D.     CYB/MEDQ  D:  06/06/2014  T:  06/07/2014  Job:  [id number]

## 2015-11-27 IMAGING — CR DG CHEST 2V
1 series · 1 of 1 positions shown · non-contrast
Comparison: 08/29/2009.

CLINICAL DATA: 68-year-old male preoperative study. Hypertension.
Initial encounter.

EXAM:
CHEST  2 VIEW

[w chest lat]
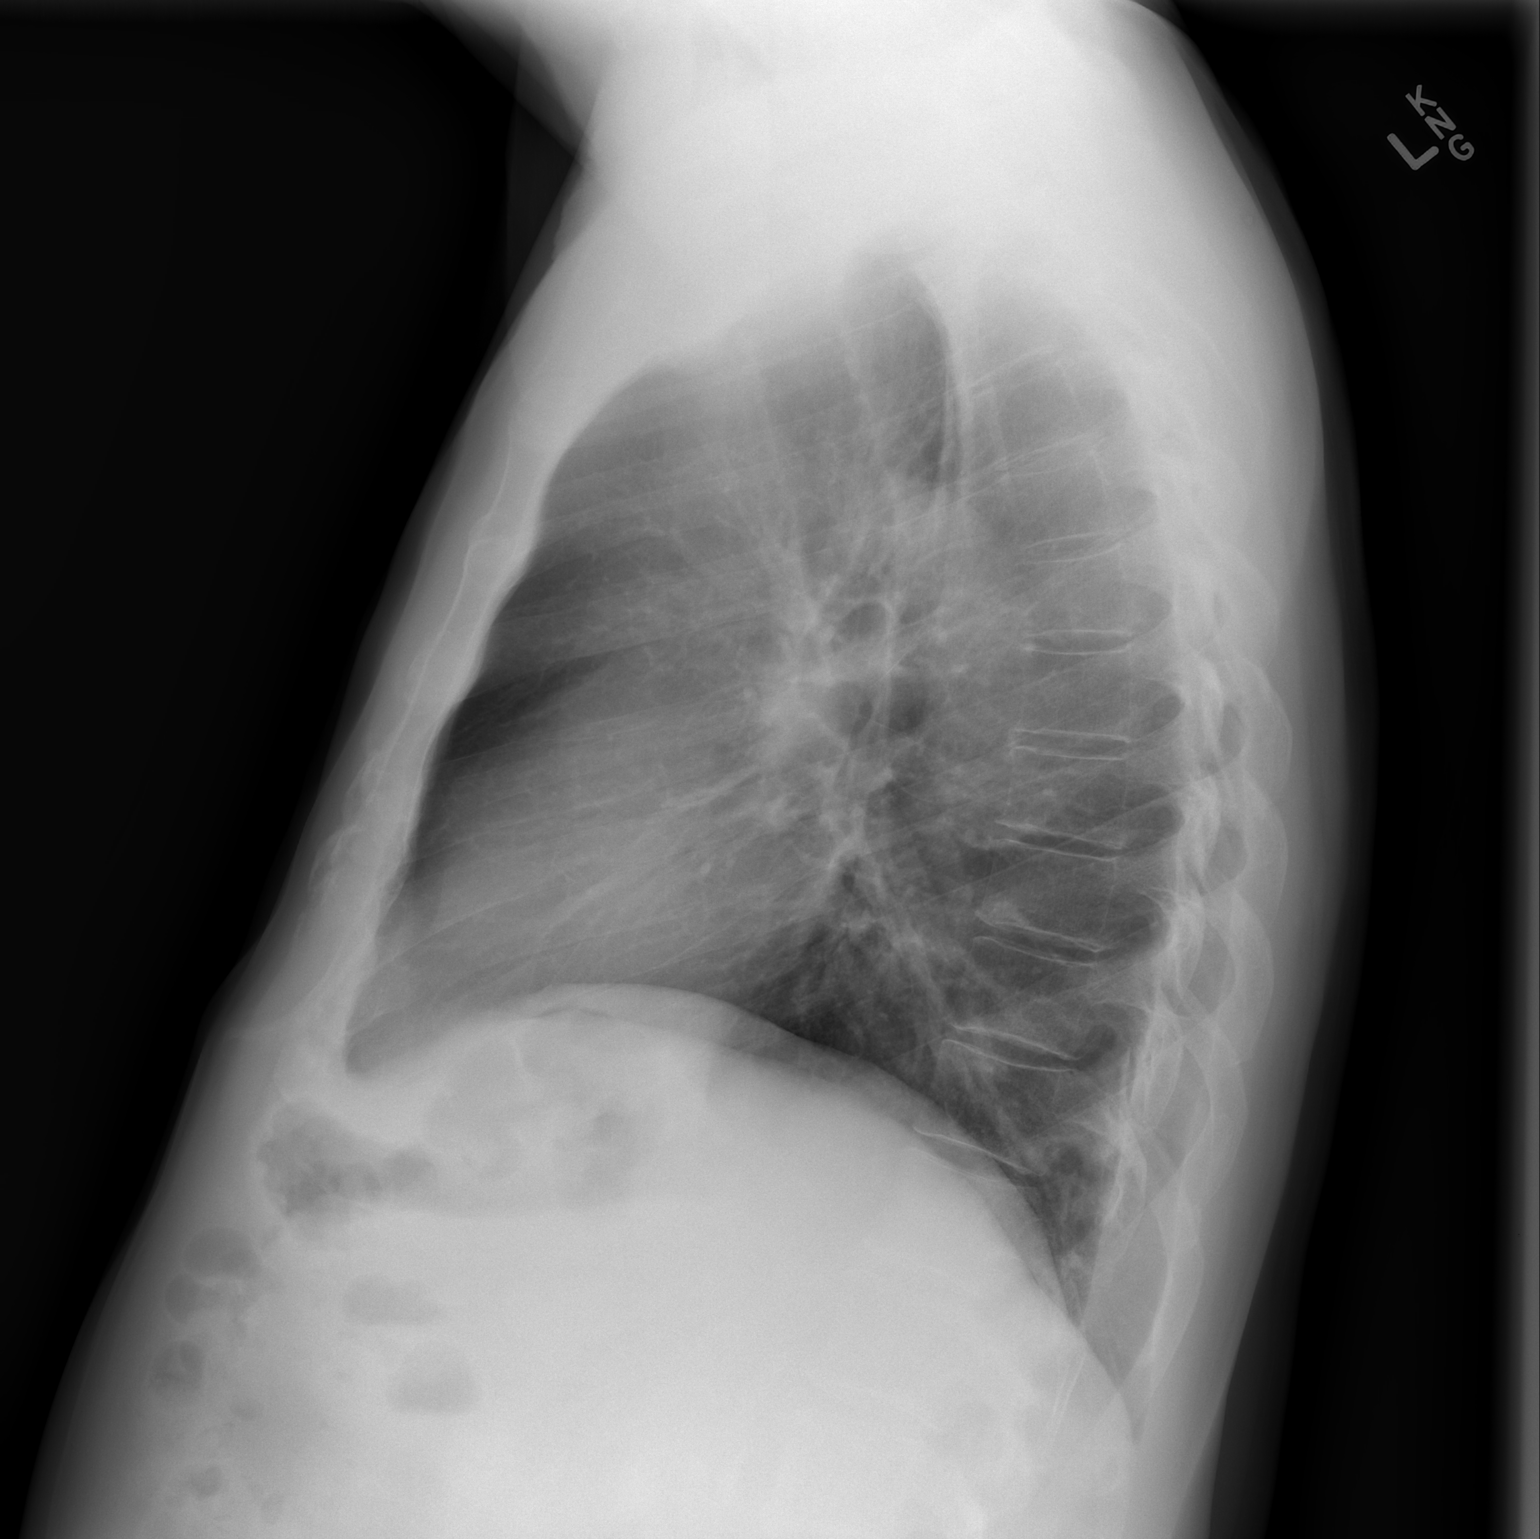

[1 of 1 positions shown; findings below may reference images not displayed]

FINDINGS: Stable lung volumes, within normal limits. Normal cardiac size and
mediastinal contours. Visualized tracheal air column is within
normal limits. No pneumothorax, pulmonary edema, pleural effusion or
confluent pulmonary opacity. Aortic calcified atherosclerosis. No
acute osseous abnormality identified.
IMPRESSION: No acute cardiopulmonary abnormality.

## 2015-12-05 IMAGING — RF DG HIP (WITH OR WITHOUT PELVIS) 2-3V*L*
1 series · 2 of 2 positions shown · non-contrast
Comparison: None.

CLINICAL DATA: Status post total hip replacement

EXAM:
LEFT HIP - COMPLETE 1 VIEW

[Series 1: run · 2 of 2 slices shown]
[im 1/2]
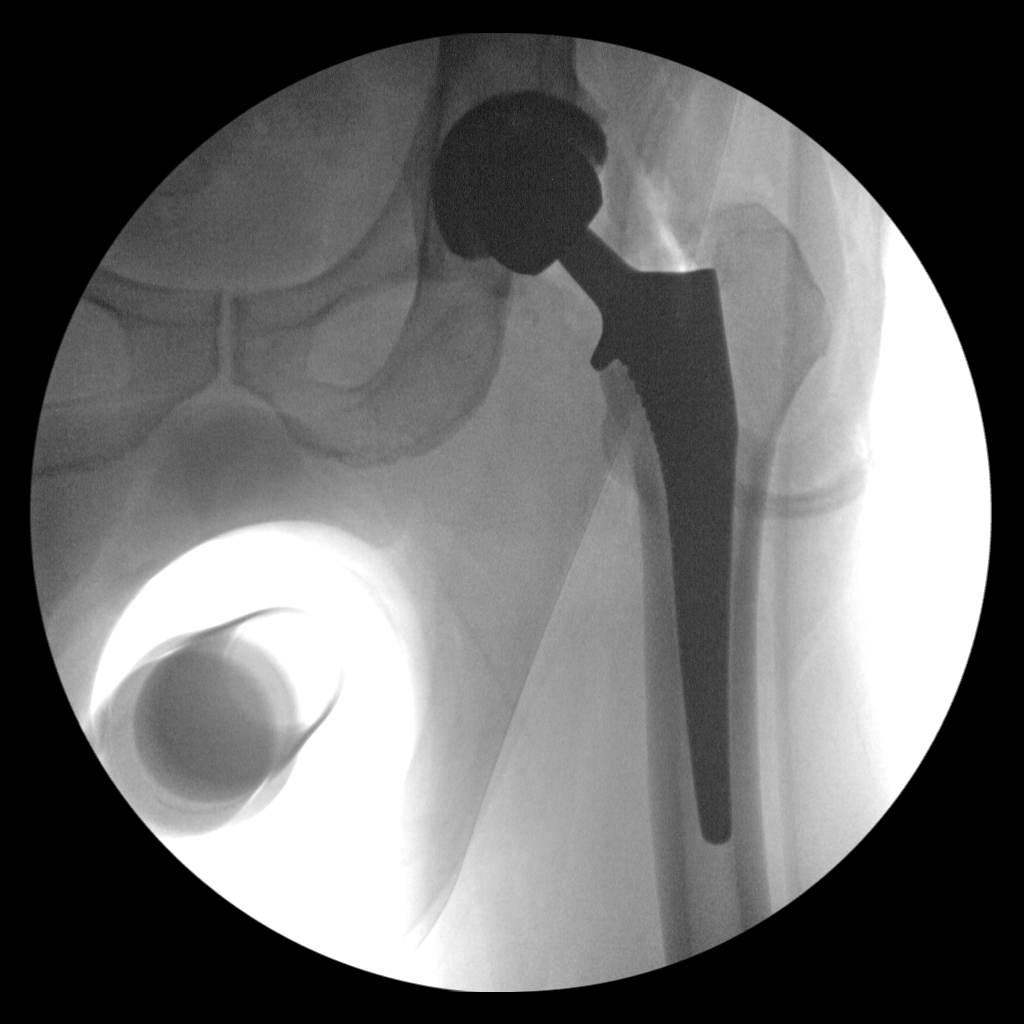
[im 2/2]
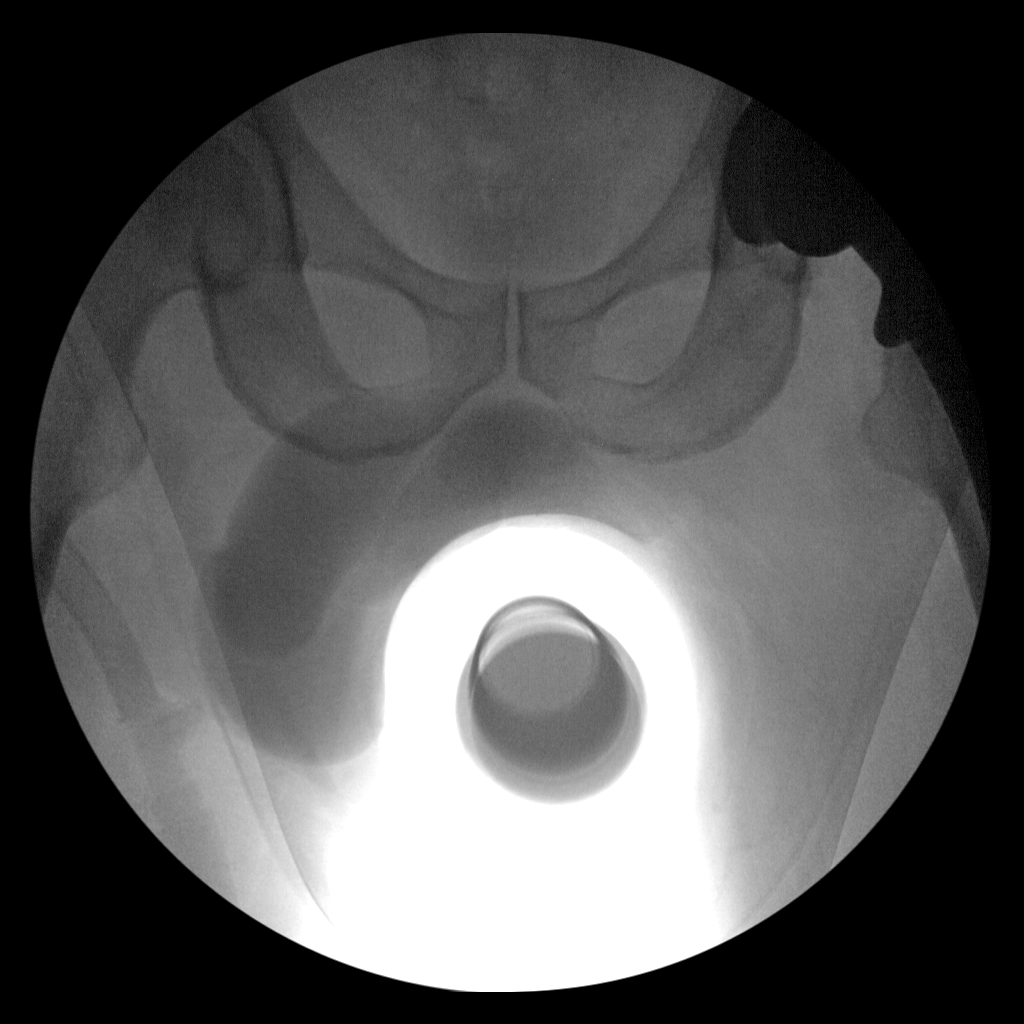

[2 of 2 positions shown; findings below may reference images not displayed]

FINDINGS: Frontal view obtained. There is a total hip prosthesis on the left
which appears well seated. No fracture or dislocation appreciable on
this single view.
IMPRESSION: Total hip prosthesis appears well-seated on single view.

## 2015-12-05 IMAGING — DX DG HIP 1V PORT*L*
1 series · 1 of 1 positions shown · non-contrast
Comparison: 02/17/2014

CLINICAL DATA: Postoperative evaluation.

EXAM:
DG C-ARM 1-60 MIN - NRPT [facility]; PORTABLE LEFT HIP - 1 VIEW

[hip x-table]
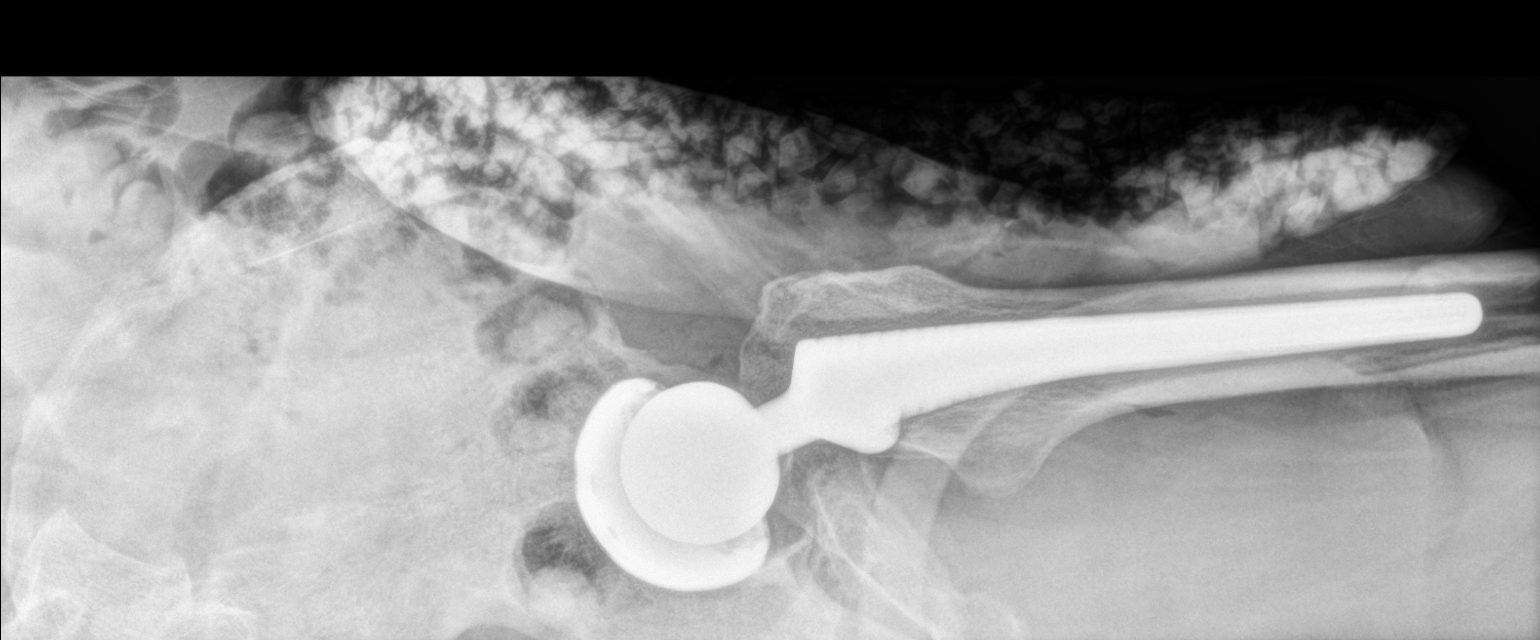

[1 of 1 positions shown; findings below may reference images not displayed]

FINDINGS: Single cross-table lateral view demonstrates total left hip
prosthesis. No definite evidence for acute fracture.
IMPRESSION: Total left hip prosthesis without definite evidence for associated
fracture on single view.

## 2015-12-05 IMAGING — DX DG PORTABLE PELVIS
1 series · 1 of 1 positions shown · non-contrast
Comparison: None.

CLINICAL DATA: Postop left hip replacement

EXAM:
PORTABLE PELVIS 1-2 VIEWS

[pelvis ap]
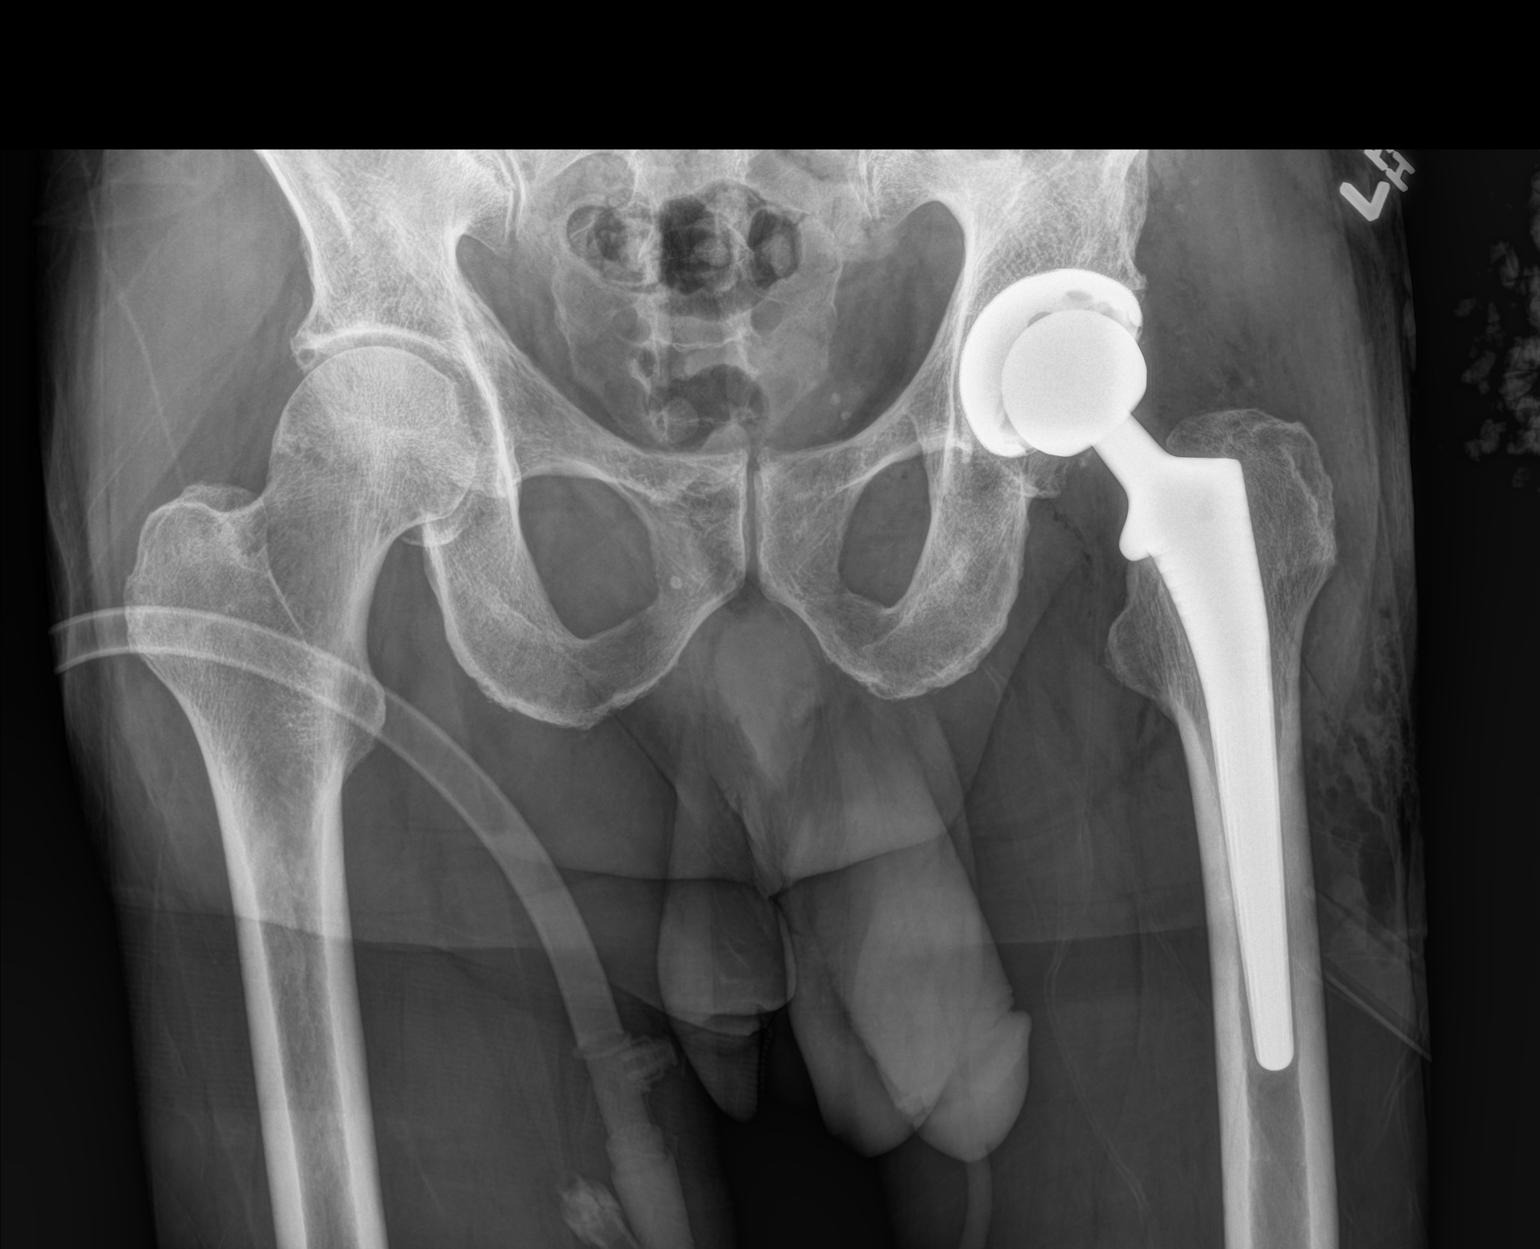

[1 of 1 positions shown; findings below may reference images not displayed]

FINDINGS: Total left hip replacement with femoral and acetabular components in
anticipated position. Anticipated soft tissue postoperative change
surrounding the proximal left femur.
IMPRESSION: Postoperative change

## 2024-01-08 ENCOUNTER — Encounter: Payer: Self-pay | Admitting: Advanced Practice Midwife
